# Patient Record
Sex: Female | Born: 1935 | Race: White | Hispanic: No | Smoking: Former smoker
Health system: Southern US, Community
[De-identification: ages and names within clinical notes are randomized; demographics above are authoritative.]

## PROBLEM LIST (undated history)

## (undated) DIAGNOSIS — K5730 Diverticulosis of large intestine without perforation or abscess without bleeding: Secondary | ICD-10-CM

## (undated) DIAGNOSIS — Z86018 Personal history of other benign neoplasm: Secondary | ICD-10-CM

## (undated) DIAGNOSIS — H9190 Unspecified hearing loss, unspecified ear: Secondary | ICD-10-CM

## (undated) DIAGNOSIS — K219 Gastro-esophageal reflux disease without esophagitis: Secondary | ICD-10-CM

## (undated) DIAGNOSIS — M958 Other specified acquired deformities of musculoskeletal system: Secondary | ICD-10-CM

## (undated) DIAGNOSIS — M1990 Unspecified osteoarthritis, unspecified site: Secondary | ICD-10-CM

## (undated) DIAGNOSIS — E785 Hyperlipidemia, unspecified: Secondary | ICD-10-CM

## (undated) DIAGNOSIS — K5792 Diverticulitis of intestine, part unspecified, without perforation or abscess without bleeding: Secondary | ICD-10-CM

## (undated) HISTORY — DX: Hyperlipidemia, unspecified: E78.5

## (undated) HISTORY — DX: Other specified acquired deformities of musculoskeletal system: M95.8

## (undated) HISTORY — DX: Unspecified osteoarthritis, unspecified site: M19.90

## (undated) HISTORY — PX: JOINT REPLACEMENT: SHX530

## (undated) HISTORY — DX: Diverticulitis of intestine, part unspecified, without perforation or abscess without bleeding: K57.92

## (undated) HISTORY — PX: ABDOMINAL HYSTERECTOMY: SHX81

## (undated) HISTORY — PX: CATARACT EXTRACTION, BILATERAL: SHX1313

## (undated) HISTORY — PX: TONSILLECTOMY AND ADENOIDECTOMY: SUR1326

## (undated) HISTORY — DX: Diverticulosis of large intestine without perforation or abscess without bleeding: K57.30

## (undated) HISTORY — DX: Gastro-esophageal reflux disease without esophagitis: K21.9

---
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
## 2004-09-22 HISTORY — PX: CHOLECYSTECTOMY: SHX55

## 2005-04-07 ENCOUNTER — Ambulatory Visit: Payer: Self-pay | Admitting: General Surgery

## 2005-04-09 ENCOUNTER — Ambulatory Visit: Payer: Self-pay | Admitting: General Surgery

## 2005-04-16 ENCOUNTER — Ambulatory Visit: Payer: Self-pay | Admitting: General Surgery

## 2007-12-24 LAB — HM DEXA SCAN

## 2009-02-21 LAB — HM PAP SMEAR: HM Pap smear: NEGATIVE

## 2011-04-24 HISTORY — PX: OTHER SURGICAL HISTORY: SHX169

## 2012-12-15 ENCOUNTER — Encounter: Payer: Self-pay | Admitting: Family Medicine

## 2012-12-15 ENCOUNTER — Telehealth: Payer: Self-pay | Admitting: Family Medicine

## 2012-12-15 ENCOUNTER — Ambulatory Visit (INDEPENDENT_AMBULATORY_CARE_PROVIDER_SITE_OTHER): Payer: Medicare PPO | Admitting: Family Medicine

## 2012-12-15 VITALS — BP 118/80 | HR 56 | Temp 97.3°F | Ht 67.0 in | Wt 171.0 lb

## 2012-12-15 DIAGNOSIS — E785 Hyperlipidemia, unspecified: Secondary | ICD-10-CM

## 2012-12-15 DIAGNOSIS — L989 Disorder of the skin and subcutaneous tissue, unspecified: Secondary | ICD-10-CM | POA: Insufficient documentation

## 2012-12-15 DIAGNOSIS — Z1211 Encounter for screening for malignant neoplasm of colon: Secondary | ICD-10-CM

## 2012-12-15 MED ORDER — ATORVASTATIN CALCIUM 10 MG PO TABS: 10.0000 mg | ORAL_TABLET | Freq: Every day | ORAL | Status: DC

## 2012-12-15 NOTE — Progress Notes (Signed)
Elevated Cholesterol: Using medications without problems:yes Muscle aches: minimal aches, not attributed to statin by patient Diet compliance:yes Exercise:yes She'll return for labs.   D/w patient [id number] for colon cancer screening, including IFOB vs. colonoscopy.  Risks and benefits of both were discussed and patient voiced understanding.  Pt elects for: IFOB.   She'll call about her mammogram.  She had neg DXA prev and f/u on this is deferred.     PMH and SH reviewed  Meds, vitals, and allergies reviewed.   ROS: See HPI.  Otherwise negative.    GEN: nad, alert and oriented HEENT: mucous membranes moist NECK: supple w/o LA CV: rrr. R clavicle with enlargement medially- prev w/u neg per patient PULM: ctab, no inc wob ABD: soft, +bs EXT: no edema SKIN: no acute rash but small round lesion noted on dorsum on L foot.

## 2012-12-15 NOTE — Assessment & Plan Note (Signed)
D/w pt.  Unclear if this is a BCC. ddx d/w pt.  She'll call about f/u with derm.

## 2012-12-15 NOTE — Telephone Encounter (Signed)
Call pt.  Prev with zostavax done and in records.  I don't see anything about a prev td or PNA shot.  It would be reasonable to get both at her convenience.  I would get a flu shot each fall.

## 2012-12-15 NOTE — Patient Instructions (Signed)
Go to the lab on the way out to get the stool cards.  We'll contact you with your lab report. Call [facility] about your mammogram.  Schedule a fasting lab visit.  I'll check your vaccine records.  I would get a flu shot each fall.   Don't change your meds for now.  I would check on the living will papers.  Glad to see you.

## 2012-12-15 NOTE — Assessment & Plan Note (Addendum)
Return for fasting labs.  Continue statin for now.  She agrees. >30 min spent with face to face with patient getting history and for physical exam.

## 2012-12-15 NOTE — Assessment & Plan Note (Signed)
D/w patient re:options for colon cancer screening, including IFOB vs. colonoscopy.  Risks and benefits of both were discussed and patient voiced understanding.  Pt elects for:IFOB  

## 2012-12-16 ENCOUNTER — Encounter: Payer: Self-pay | Admitting: Family Medicine

## 2012-12-16 LAB — TSH: TSH: 2.47

## 2012-12-16 LAB — GLUCOSE (CC13): Creat: 1.02

## 2012-12-16 NOTE — Telephone Encounter (Signed)
Patient advised.  She will call in to schedule lab visit and nurse visit simultaneously.

## 2012-12-23 ENCOUNTER — Ambulatory Visit (INDEPENDENT_AMBULATORY_CARE_PROVIDER_SITE_OTHER): Payer: Medicare PPO | Admitting: *Deleted

## 2012-12-23 ENCOUNTER — Other Ambulatory Visit (INDEPENDENT_AMBULATORY_CARE_PROVIDER_SITE_OTHER): Payer: Medicare PPO

## 2012-12-23 DIAGNOSIS — Z23 Encounter for immunization: Secondary | ICD-10-CM

## 2012-12-23 DIAGNOSIS — E785 Hyperlipidemia, unspecified: Secondary | ICD-10-CM

## 2012-12-23 LAB — COMPREHENSIVE METABOLIC PANEL
AST: 28 U/L (ref 0–37)
BUN: 18 mg/dL (ref 6–23)
CO2: 29 mEq/L (ref 19–32)
Calcium: 8.8 mg/dL (ref 8.4–10.5)
Chloride: 104 mEq/L (ref 96–112)
Creatinine, Ser: 1 mg/dL (ref 0.4–1.2)
GFR: 59.62 mL/min — ABNORMAL LOW (ref >60.00)

## 2012-12-23 LAB — LIPID PANEL
Cholesterol: 162 mg/dL (ref 0–200)
HDL: 39.5 mg/dL (ref >39.00)
Triglycerides: 130 mg/dL (ref 0.0–149.0)

## 2012-12-24 ENCOUNTER — Other Ambulatory Visit (INDEPENDENT_AMBULATORY_CARE_PROVIDER_SITE_OTHER): Payer: Medicare PPO

## 2012-12-24 DIAGNOSIS — Z1211 Encounter for screening for malignant neoplasm of colon: Secondary | ICD-10-CM

## 2012-12-27 ENCOUNTER — Encounter: Payer: Self-pay | Admitting: Family Medicine

## 2013-06-17 ENCOUNTER — Encounter: Payer: Self-pay | Admitting: Family Medicine

## 2013-06-20 ENCOUNTER — Encounter: Payer: Self-pay | Admitting: *Deleted

## 2013-09-22 HISTORY — PX: CATARACT EXTRACTION: SUR2

## 2013-11-24 ENCOUNTER — Ambulatory Visit: Payer: Medicare PPO | Admitting: Internal Medicine

## 2013-11-28 ENCOUNTER — Encounter: Payer: Self-pay | Admitting: Internal Medicine

## 2013-11-28 ENCOUNTER — Ambulatory Visit: Payer: Medicare PPO | Admitting: Family Medicine

## 2013-11-28 ENCOUNTER — Ambulatory Visit (INDEPENDENT_AMBULATORY_CARE_PROVIDER_SITE_OTHER): Payer: Medicare PPO | Admitting: Internal Medicine

## 2013-11-28 VITALS — BP 126/78 | HR 64 | Temp 97.7°F | Wt 162.0 lb

## 2013-11-28 DIAGNOSIS — J069 Acute upper respiratory infection, unspecified: Secondary | ICD-10-CM

## 2013-11-28 DIAGNOSIS — H6120 Impacted cerumen, unspecified ear: Secondary | ICD-10-CM

## 2013-11-28 NOTE — Progress Notes (Signed)
Pre visit review using our clinic review tool, if applicable. No additional management support is needed unless otherwise documented below in the visit note. 

## 2013-11-28 NOTE — Progress Notes (Signed)
HPI  Pt presents to the clinic today with c/o nasal congestion, sore throat, cough and chest congestion. She reports this started 1 week ago. The cough is productive of thick white sputum.. She denies fever, chills, body aches shortness of breath or wheezing. She has no history of allergies. She does have asthma. She does feel like her symptoms are improving.  Review of Systems      Past Medical History  Diagnosis Date  . Hyperlipidemia   . Acquired clavicle deformity     medial portion of R clavicle with enlargement- prev neg w/u per patient report  . Diverticulosis of colon without hemorrhage   . Arthritis     Osteoarthritis  . Hypertension   . Asthma     Intermittent  . GERD (gastroesophageal reflux disease)     Family History  Problem Relation Age of Onset  . Stroke Mother   . COPD Sister   . Thyroid disease Sister   . Heart disease Brother   . Colon cancer Neg Hx   . Thyroid disease Other     History   Social History  . Marital Status: Married    Spouse Name: N/A    Number of Children: N/A  . Years of Education: N/A   Occupational History  . Not on file.   Social History Main Topics  . Smoking status: Former Research scientist (life sciences)  . Smokeless tobacco: Never Used  . Alcohol Use: Yes     Comment: rare  . Drug Use: No  . Sexual Activity: Not on file   Other Topics Concern  . Not on file   Social History Narrative   Married 1977- husband with cancer as of 2014   2 step children   retired    No Known Allergies   Constitutional: Positive fatigue. Denies headache, fever or abrupt weight changes.  HEENT:  Positive nasal congestion. Denies eye redness, eye pain, pressure behind the eyes, facial pain, ear pain, ringing in the ears, wax buildup, runny nose or bloody nose. Respiratory: Positive cough. Denies difficulty breathing or shortness of breath.  Cardiovascular: Denies chest pain, chest tightness, palpitations or swelling in the hands or feet.   No other specific  complaints in a complete review of systems (except as listed in HPI above).  Objective:   BP 126/78  Pulse 64  Temp(Src) 97.7 F (36.5 C) (Oral)  Wt 162 lb (73.483 kg)  SpO2 97% Wt Readings from Last 3 Encounters:  11/28/13 162 lb (73.483 kg)  12/15/12 171 lb (77.565 kg)     General: Appears her stated age, well developed, well nourished in NAD. HEENT: Head: normal shape and size; Eyes: sclera white, no icterus, conjunctiva pink, PERRLA and EOMs intact; Ears: cerumen impaction on the left; Nose: mucosa pink and moist, septum midline; Throat/Mouth: + PND. Teeth present, mucosa erythematous and moist, no exudate noted, no lesions or ulcerations noted.  Neck:Neck supple, trachea midline. No massses, lumps or thyromegaly present.  Cardiovascular: Normal rate and rhythm. S1,S2 noted.  No murmur, rubs or gallops noted. No JVD or BLE edema. No carotid bruits noted. Pulmonary/Chest: Normal effort and positive vesicular breath sounds. No respiratory distress. No wheezes, rales or ronchi noted.      Assessment & Plan:   Upper Respiratory Infection, likely viral:  Seems to be improving Continue Mucinex for now [facility] to use cough drops for cough Monitor for fever, chills, purulent drainage  Left ear cerumen impaction:  Manually removed with curette by this provider OK to  use Debrox OTC  RTC as needed or if symptoms persist.

## 2013-11-28 NOTE — Patient Instructions (Signed)

## 2014-03-20 ENCOUNTER — Other Ambulatory Visit: Payer: Self-pay | Admitting: Family Medicine

## 2014-03-25 ENCOUNTER — Other Ambulatory Visit: Payer: Self-pay | Admitting: Family Medicine

## 2014-04-05 ENCOUNTER — Ambulatory Visit: Payer: Self-pay | Admitting: Ophthalmology

## 2014-04-10 ENCOUNTER — Ambulatory Visit: Payer: Self-pay | Admitting: Ophthalmology

## 2014-05-31 ENCOUNTER — Emergency Department: Payer: Self-pay | Admitting: Emergency Medicine

## 2014-05-31 ENCOUNTER — Ambulatory Visit: Payer: Medicare PPO | Admitting: Family Medicine

## 2014-05-31 LAB — COMPREHENSIVE METABOLIC PANEL
ALBUMIN: 3.6 g/dL (ref 3.4–5.0)
ALK PHOS: 111 U/L
Anion Gap: 7 (ref 7–16)
BUN: 25 mg/dL — ABNORMAL HIGH (ref 7–18)
Bilirubin,Total: 1.2 mg/dL — ABNORMAL HIGH (ref 0.2–1.0)
CHLORIDE: 104 mmol/L (ref 98–107)
CREATININE: 1.21 mg/dL (ref 0.60–1.30)
Calcium, Total: 8.9 mg/dL (ref 8.5–10.1)
Co2: 24 mmol/L (ref 21–32)
EGFR (African American): 49 — ABNORMAL LOW
EGFR (Non-African Amer.): 42 — ABNORMAL LOW
Glucose: 131 mg/dL — ABNORMAL HIGH (ref 65–99)
Osmolality: 276 (ref 275–301)
POTASSIUM: 3.8 mmol/L (ref 3.5–5.1)
SGOT(AST): 18 U/L (ref 15–37)
SGPT (ALT): 21 U/L
Sodium: 135 mmol/L — ABNORMAL LOW (ref 136–145)
TOTAL PROTEIN: 7.5 g/dL (ref 6.4–8.2)

## 2014-05-31 LAB — URINALYSIS, COMPLETE
BACTERIA: NONE SEEN
BILIRUBIN, UR: NEGATIVE
Blood: NEGATIVE
GLUCOSE, UR: NEGATIVE mg/dL (ref 0–75)
Hyaline Cast: 3
Ketone: NEGATIVE
LEUKOCYTE ESTERASE: NEGATIVE
Nitrite: NEGATIVE
PH: 5 (ref 4.5–8.0)
Protein: NEGATIVE
RBC,UR: NONE SEEN /HPF (ref 0–5)
Specific Gravity: 1.01 (ref 1.003–1.030)
Squamous Epithelial: 3

## 2014-05-31 LAB — CBC
HCT: 45.5 % (ref 35.0–47.0)
HGB: 14.6 g/dL (ref 12.0–16.0)
MCH: 29.4 pg (ref 26.0–34.0)
MCHC: 32.1 g/dL (ref 32.0–36.0)
MCV: 91 fL (ref 80–100)
Platelet: 226 10*3/uL (ref 150–440)
RBC: 4.98 10*6/uL (ref 3.80–5.20)
RDW: 13.3 % (ref 11.5–14.5)
WBC: 11 10*3/uL (ref 3.6–11.0)

## 2014-05-31 LAB — LIPASE, BLOOD: Lipase: 165 U/L (ref 73–393)

## 2014-05-31 LAB — TROPONIN I

## 2014-06-06 ENCOUNTER — Ambulatory Visit (INDEPENDENT_AMBULATORY_CARE_PROVIDER_SITE_OTHER): Payer: Medicare PPO | Admitting: Family Medicine

## 2014-06-06 ENCOUNTER — Encounter: Payer: Self-pay | Admitting: Family Medicine

## 2014-06-06 VITALS — BP 116/80 | HR 65 | Temp 97.7°F | Wt 164.2 lb

## 2014-06-06 DIAGNOSIS — M7062 Trochanteric bursitis, left hip: Secondary | ICD-10-CM

## 2014-06-06 DIAGNOSIS — K5732 Diverticulitis of large intestine without perforation or abscess without bleeding: Secondary | ICD-10-CM

## 2014-06-06 DIAGNOSIS — M76899 Other specified enthesopathies of unspecified lower limb, excluding foot: Secondary | ICD-10-CM

## 2014-06-06 NOTE — Patient Instructions (Signed)
I am presuming you had either diverticulitis or colitis.  You would treat them the same way for now.  You look good.  If you have more troubles, then notify me.

## 2014-06-06 NOTE — Progress Notes (Signed)
Pre visit review using our clinic review tool, if applicable. No additional management support is needed unless otherwise documented below in the visit note.  She had mult episodes over the last few years where she would think she had a "24 hour stomach bug".  It would get better.  Then last week she had more pain, persistent.  Went to ER for abd pain and bloating. CT done, dx'd with colitis. She is feeling better, now with less bloating, BMs back to normal, and no pain.  She fell 06/02/14, accidentally, crossing a ditch.  No LOC. Still with some mild L hip pain, better now than prev.  Can bear weight.  "My pride got hurt worse than anything else."  Some discomfort right when getting out of the chair, but then it gets better.  Discussed fall precautions.    Prev with sigmoidoscopy prev done.    Meds, vitals, and allergies reviewed.   ROS: See HPI.  Otherwise, noncontributory.  nad ncat Mmm rrr ctab Abd: not ttp, normal BS, no rebound, no masses Ext w/o edema.  L hip with normal ROM, back not ttp Minimally ttp at the L trochanteric bursa.  Normal gait, normal weight bearing.

## 2014-06-07 ENCOUNTER — Encounter: Payer: Self-pay | Admitting: Family Medicine

## 2014-06-07 DIAGNOSIS — K5732 Diverticulitis of large intestine without perforation or abscess without bleeding: Secondary | ICD-10-CM | POA: Insufficient documentation

## 2014-06-07 DIAGNOSIS — M7060 Trochanteric bursitis, unspecified hip: Secondary | ICD-10-CM | POA: Insufficient documentation

## 2014-06-07 NOTE — Assessment & Plan Note (Signed)
Mild, improving, no need to image.  Anatomy d/w pt.  F/u prn.  She agrees.

## 2014-06-07 NOTE — Assessment & Plan Note (Signed)
Now much improved, resolved.  CT reports from hospital noted.   Would hesitate for colonoscopy given her age, d/w pt, esp since she is so much better, so quickly.  We can refer if needed in the future.  She agrees.  Continue abx for now with routine cautions.  >25 minutes spent in face to face time with patient, >50% spent in counselling or coordination of care.

## 2014-06-20 ENCOUNTER — Encounter: Payer: Self-pay | Admitting: Family Medicine

## 2014-06-20 ENCOUNTER — Ambulatory Visit (INDEPENDENT_AMBULATORY_CARE_PROVIDER_SITE_OTHER): Payer: Medicare PPO | Admitting: Family Medicine

## 2014-06-20 VITALS — BP 110/78 | HR 74 | Temp 97.9°F | Wt 159.0 lb

## 2014-06-20 DIAGNOSIS — K5732 Diverticulitis of large intestine without perforation or abscess without bleeding: Secondary | ICD-10-CM

## 2014-06-20 LAB — CBC WITH DIFFERENTIAL/PLATELET
BASOS PCT: 0.5 % (ref 0.0–3.0)
Basophils Absolute: 0 10*3/uL (ref 0.0–0.1)
EOS PCT: 1.8 % (ref 0.0–5.0)
Eosinophils Absolute: 0.1 10*3/uL (ref 0.0–0.7)
HEMATOCRIT: 40 % (ref 36.0–46.0)
Hemoglobin: 13.2 g/dL (ref 12.0–15.0)
LYMPHS ABS: 1.3 10*3/uL (ref 0.7–4.0)
Lymphocytes Relative: 16.4 % (ref 12.0–46.0)
MCHC: 33 g/dL (ref 30.0–36.0)
MCV: 90.1 fl (ref 78.0–100.0)
MONO ABS: 0.7 10*3/uL (ref 0.1–1.0)
Monocytes Relative: 8.6 % (ref 3.0–12.0)
Neutro Abs: 5.9 10*3/uL (ref 1.4–7.7)
Neutrophils Relative %: 72.7 % (ref 43.0–77.0)
PLATELETS: 253 10*3/uL (ref 150.0–400.0)
RBC: 4.44 Mil/uL (ref 3.87–5.11)
RDW: 13.3 % (ref 11.5–15.5)
WBC: 8.2 10*3/uL (ref 4.0–10.5)

## 2014-06-20 MED ORDER — CIPROFLOXACIN HCL 500 MG PO TABS: 500.0000 mg | ORAL_TABLET | Freq: Two times a day (BID) | ORAL | Status: DC

## 2014-06-20 MED ORDER — METRONIDAZOLE 500 MG PO TABS: 500.0000 mg | ORAL_TABLET | Freq: Two times a day (BID) | ORAL | Status: DC

## 2014-06-20 NOTE — Patient Instructions (Signed)
Go to the lab on the way out.  We'll contact you with your lab report.  Restart the antibiotics in the meantime if you have more abdominal pain.  Update [place] if needed.  Rosaria Ferries will call about your referral.

## 2014-06-20 NOTE — Progress Notes (Signed)
Pre visit review using our clinic review tool, if applicable. No additional management support is needed unless otherwise documented below in the visit note.  She had likely diverticulitis, dx'd with colitis. She improved on abx, done with meds now.  In the last few days, some return of lower abd pain, some black stools, some diarrhea.  No gross blood in stool.  No fevers.  No abd pain now.  She is some better today than the last few days.  She brought a stool sample today.    Meds, vitals, and allergies reviewed.   ROS: See HPI.  Otherwise, noncontributory.  GEN: nad, alert and oriented HEENT: mucous membranes moist NECK: supple w/o LA CV: rrr.  PULM: ctab, no inc wob ABD: soft, +bs, not ttp, no rebound EXT: no edema SKIN: no acute rash

## 2014-06-21 NOTE — Assessment & Plan Note (Signed)
Hold abx for now, if worsening then restart.  She agrees.  She is some better today.   Recheck CBC and check IFOB today.  See notes on labs.  Refer to GI.  She agrees.  Okay for outpatient f/u.

## 2014-06-22 ENCOUNTER — Other Ambulatory Visit (INDEPENDENT_AMBULATORY_CARE_PROVIDER_SITE_OTHER): Payer: Medicare PPO

## 2014-06-22 DIAGNOSIS — K5732 Diverticulitis of large intestine without perforation or abscess without bleeding: Secondary | ICD-10-CM

## 2014-06-22 LAB — FECAL OCCULT BLOOD, IMMUNOCHEMICAL: FECAL OCCULT BLD: POSITIVE — AB

## 2014-06-23 ENCOUNTER — Ambulatory Visit: Payer: Medicare PPO | Admitting: Gastroenterology

## 2014-06-23 ENCOUNTER — Telehealth: Payer: Self-pay | Admitting: Family Medicine

## 2014-06-23 ENCOUNTER — Telehealth: Payer: Self-pay | Admitting: Gastroenterology

## 2014-06-23 NOTE — Telephone Encounter (Signed)
No Charge 

## 2014-06-23 NOTE — Telephone Encounter (Signed)
Pt returned call regarding lab results. Please call home #

## 2014-07-10 ENCOUNTER — Encounter: Payer: Self-pay | Admitting: Gastroenterology

## 2014-07-10 ENCOUNTER — Ambulatory Visit (INDEPENDENT_AMBULATORY_CARE_PROVIDER_SITE_OTHER): Payer: Medicare PPO | Admitting: Gastroenterology

## 2014-07-10 VITALS — BP 130/82 | HR 60 | Ht 66.0 in | Wt 162.1 lb

## 2014-07-10 DIAGNOSIS — R195 Other fecal abnormalities: Secondary | ICD-10-CM

## 2014-07-10 MED ORDER — NA SULFATE-K SULFATE-MG SULF 17.5-3.13-1.6 GM/177ML PO SOLN: 1.0000 | Freq: Once | ORAL | Status: DC

## 2014-07-10 NOTE — Progress Notes (Signed)
_                                                                                                                History of Present Illness:  Linda Juarez is an 80 year old white female referred for evaluation of abdominal pain.  In September, 2015 she had a couple of weeks of moderately severe left lower quadrant pain.  She was evaluated by CT which, by report, demonstrated diffuse wall thickening involving loops of the distal ileum suggestive of enteritis of either infectious or ischemic causes, a small amount of adjacent free fluid, and colonic diverticulosis.  Question of mild proximal sigmoid colonic wall thickening was raised.  She was placed on Cipro and Flagyl with resolution of symptoms.  While having diarrhea she noted one episode of black stools.  She tested Hemoccult positive.  Hemoglobin on September 29 was 13.2.  She is without pain-free.  She has no GI complaints.  Bowels are back to normal.  She claims that, in the past, she's had previous episodes of lower abdominal pain followed by nausea and vomiting that usually do not last more than 24 hours.  Patient's never had a colonoscopy.   Past Medical History  Diagnosis Date  . Hyperlipidemia   . Acquired clavicle deformity     medial portion of R clavicle with enlargement- prev neg w/u per patient report  . Diverticulosis of colon without hemorrhage   . Arthritis     Osteoarthritis  . Hypertension   . Asthma     Intermittent  . GERD (gastroesophageal reflux disease)   . Diverticulitis    Past Surgical History  Procedure Laterality Date  . Tonsillectomy and adenoidectomy    . Cholecystectomy  2006  . Abdominal hysterectomy  1980's  . Stress cardiolite  04/24/11    Normal perfusion study with no stress induced ischemia   family history includes COPD in her sister; Heart disease in her brother; Stroke in her mother; Thyroid disease in her other and sister. There is no history of Colon cancer, Colon polyps,  Diabetes, Kidney disease, or Esophageal cancer. Current Outpatient Prescriptions  Medication Sig Dispense Refill  . aspirin 81 MG tablet Take 81 mg by mouth daily.      Marland Kitchen atorvastatin (LIPITOR) 10 MG tablet TAKE 1 TABLET BY MOUTH EVERY DAY  90 tablet  1  . Cholecalciferol (VITAMIN D-3) 1000 UNITS CAPS Take 1 capsule by mouth daily.      . Multiple Vitamins-Minerals (CENTRUM SILVER ADULT 50+) TABS Take 1 tablet by mouth as needed.        No current facility-administered medications for this visit.   Allergies as of 07/10/2014  . (No Known Allergies)    reports that she quit smoking about 27 years ago. Her smoking use included Cigarettes. She smoked 0.50 packs per day. She has never used smokeless tobacco. She reports that she drinks alcohol. She reports that she does not use illicit drugs.   Review of Systems: Pertinent positive and negative review of  systems were noted in the above HPI section. All other review of systems were otherwise negative.  Vital signs were reviewed in today's medical record Physical Exam: General: Well developed , well nourished, no acute distress Skin: anicteric Head: Normocephalic and atraumatic Eyes:  sclerae anicteric, EOMI Ears: Normal auditory acuity Mouth: No deformity or lesions Neck: Supple, no masses or thyromegaly Lungs: Clear throughout to auscultation Heart: Regular rate and rhythm; no murmurs, rubs or bruits Abdomen: Soft, non tender and non distended. No masses, hepatosplenomegaly or hernias noted. Normal Bowel sounds Rectal:deferred Musculoskeletal: Symmetrical with no gross deformities  Skin: No lesions on visible extremities Pulses:  Normal pulses noted Extremities: No clubbing, cyanosis, edema or deformities noted Neurological: Alert oriented x 4, grossly nonfocal Cervical Nodes:  No significant cervical adenopathy Inguinal Nodes: No significant inguinal adenopathy Psychological:  Alert and cooperative. Normal mood and affect  See  Assessment and Plan under Problem List

## 2014-07-10 NOTE — Assessment & Plan Note (Signed)
Plan screening colonoscopy 

## 2014-07-10 NOTE — Patient Instructions (Signed)
You have been scheduled for a colonoscopy. Please follow written instructions given to you at your visit today.  Please pick up your prep kit at the pharmacy within the next 1-3 days. If you use inhalers (even only as needed), please bring them with you on the day of your procedure. Your physician has requested that you go to www.startemmi.com and enter the access code given to you at your visit today. This web site gives a general overview about your procedure. However, you should still follow specific instructions given to you by our office regarding your preparation for the procedure.

## 2014-07-10 NOTE — Assessment & Plan Note (Signed)
Recent episode of prolonged left lower quadrant pain with equivocal CT findings suggestive of thickening of the terminal ileum, questionable  thickening of the proximal sigmoid versus underdistention.  She may have had an enteric infection.  Diverticulitis seems less likely.  It is noteworthy that she tested Hemoccult positive.  Recommendations #1 colonoscopy

## 2014-07-19 ENCOUNTER — Encounter: Payer: Self-pay | Admitting: Gastroenterology

## 2014-08-28 ENCOUNTER — Telehealth: Payer: Self-pay | Admitting: Gastroenterology

## 2014-08-28 NOTE — Telephone Encounter (Signed)
No

## 2014-08-30 ENCOUNTER — Encounter: Payer: Medicare PPO | Admitting: Gastroenterology

## 2014-09-04 ENCOUNTER — Encounter: Payer: Self-pay | Admitting: Family Medicine

## 2014-12-26 ENCOUNTER — Telehealth: Payer: Self-pay | Admitting: Family Medicine

## 2014-12-26 NOTE — Telephone Encounter (Signed)
Patient called back to let you know she had her flu shot at [facility] on 12/25/14.  Please document.

## 2014-12-26 NOTE — Telephone Encounter (Signed)
Documented in chart.

## 2015-01-13 NOTE — Op Note (Signed)
PATIENT NAME:  Linda Juarez, VIOLANTE MR#:  [id number] DATE OF BIRTH:  [birth year]  DATE OF PROCEDURE:  04/10/2014  PREOPERATIVE DIAGNOSIS:  Cataract, left eye.    POSTOPERATIVE DIAGNOSIS:  Cataract, left eye.  PROCEDURE PERFORMED:  Extracapsular cataract extraction using phacoemulsification with placement of an Alcon [id number], 23.5-diopter posterior chamber lens, serial [id number].  SURGEON:  Loura Back. Jenny Lai, MD  ASSISTANT:  None.  ANESTHESIA:  4% lidocaine and 0.75% Marcaine in a 50/50 mixture with 10 units/mL of Hylenex added, given as a peribulbar.   ANESTHESIOLOGIST:  Dr. Benjamine Mola.  COMPLICATIONS:  None.  ESTIMATED BLOOD LOSS:  Less than 1 ml.  DESCRIPTION OF PROCEDURE:  The patient was brought to the operating room and given a peribulbar block.  The patient was then prepped and draped in the usual fashion.  The vertical rectus muscles were imbricated using 5-0 silk sutures.  These sutures were then clamped to the sterile drapes as bridle sutures.  A limbal peritomy was performed extending two clock hours and hemostasis was obtained with cautery.  A partial thickness scleral groove was made at the surgical limbus and dissected anteriorly in a lamellar dissection using an Alcon crescent knife.  The anterior chamber was entered supero-temporally with a Superblade and through the lamellar dissection with a 2.6 mm keratome.  DisCoVisc was used to replace the aqueous and a continuous tear capsulorrhexis was carried out.  Hydrodissection and hydrodelineation were carried out with balanced salt and a 27 gauge canula.  The nucleus was rotated to confirm the effectiveness of the hydrodissection.  Phacoemulsification was carried out using a divide-and-conquer technique.  Total ultrasound time was 1 minute and 54 seconds with an average power of 26.9 percent and CDE of 54.86.  Irrigation/aspiration was used to remove the residual cortex.  DisCoVisc was used to inflate the capsule and the internal  incision was enlarged to 3 mm with the crescent knife.  The intraocular lens was folded and inserted into the capsular bag using the AcrySert delivery system. Irrigation/aspiration was used to remove the residual DisCoVisc.  Miostat was injected into the anterior chamber through the paracentesis track to inflate the anterior chamber and induce miosis. A tenth of a milliliter of cefuroxime was injected via the paracentesis tract containing 1 mg of drug. The wound was checked for leaks and none were found. The conjunctiva was closed with cautery and the bridle sutures were removed.  Two drops of 0.3% Vigamox were placed on the eye.   An eye shield was placed on the eye.  The patient was discharged to the recovery room in good condition.   ____________________________ Loura Back Oluwanifemi Petitti, MD sad:sb D: 04/10/2014 10:52:35 ET T: 04/10/2014 11:15:39 ET JOB#: [id number]  cc: Remo Lipps A. Gari Hartsell, MD, <Dictator> Martie Lee MD ELECTRONICALLY SIGNED 04/10/2014 12:35

## 2015-01-16 ENCOUNTER — Other Ambulatory Visit: Payer: Self-pay | Admitting: Family Medicine

## 2015-01-16 DIAGNOSIS — E785 Hyperlipidemia, unspecified: Secondary | ICD-10-CM | POA: Insufficient documentation

## 2015-01-18 ENCOUNTER — Other Ambulatory Visit (INDEPENDENT_AMBULATORY_CARE_PROVIDER_SITE_OTHER): Payer: Medicare PPO

## 2015-01-18 DIAGNOSIS — E785 Hyperlipidemia, unspecified: Secondary | ICD-10-CM

## 2015-01-18 LAB — LIPID PANEL
Cholesterol: 225 mg/dL — ABNORMAL HIGH (ref 0–200)
HDL: 47.1 mg/dL (ref >39.00)
LDL Cholesterol: 159 mg/dL — ABNORMAL HIGH (ref 0–99)
NonHDL: 177.9
Total CHOL/HDL Ratio: 5
Triglycerides: 94 mg/dL (ref 0.0–149.0)
VLDL: 18.8 mg/dL (ref 0.0–40.0)

## 2015-01-18 LAB — COMPREHENSIVE METABOLIC PANEL
ALT: 24 U/L (ref 0–35)
AST: 25 U/L (ref 0–37)
Albumin: 4.1 g/dL (ref 3.5–5.2)
Alkaline Phosphatase: 95 U/L (ref 39–117)
BUN: 20 mg/dL (ref 6–23)
CALCIUM: 9.5 mg/dL (ref 8.4–10.5)
CHLORIDE: 103 meq/L (ref 96–112)
CO2: 30 meq/L (ref 19–32)
CREATININE: 1 mg/dL (ref 0.40–1.20)
GFR: 56.58 mL/min — AB (ref >60.00)
Glucose, Bld: 93 mg/dL (ref 70–99)
Potassium: 4.2 mEq/L (ref 3.5–5.1)
Sodium: 138 mEq/L (ref 135–145)
Total Bilirubin: 0.5 mg/dL (ref 0.2–1.2)
Total Protein: 6.8 g/dL (ref 6.0–8.3)

## 2015-01-26 ENCOUNTER — Ambulatory Visit (INDEPENDENT_AMBULATORY_CARE_PROVIDER_SITE_OTHER): Payer: Medicare PPO | Admitting: Family Medicine

## 2015-01-26 ENCOUNTER — Encounter: Payer: Self-pay | Admitting: Family Medicine

## 2015-01-26 VITALS — BP 140/80 | HR 63 | Temp 97.6°F | Ht 66.0 in | Wt 161.0 lb

## 2015-01-26 DIAGNOSIS — E785 Hyperlipidemia, unspecified: Secondary | ICD-10-CM

## 2015-01-26 DIAGNOSIS — Z23 Encounter for immunization: Secondary | ICD-10-CM | POA: Diagnosis not present

## 2015-01-26 DIAGNOSIS — L989 Disorder of the skin and subcutaneous tissue, unspecified: Secondary | ICD-10-CM | POA: Diagnosis not present

## 2015-01-26 DIAGNOSIS — Z0000 Encounter for general adult medical examination without abnormal findings: Secondary | ICD-10-CM

## 2015-01-26 DIAGNOSIS — Z7189 Other specified counseling: Secondary | ICD-10-CM

## 2015-01-26 NOTE — Progress Notes (Signed)
Pre visit review using our clinic review tool, if applicable. No additional management support is needed unless otherwise documented below in the visit note.  I have personally reviewed the Medicare Annual Wellness questionnaire and have noted 1. The patient's medical and social history 2. Their use of alcohol, tobacco or illicit drugs 3. Their current medications and supplements 4. The patient's functional ability including ADL's, fall risks, home safety risks and hearing or visual             impairment. 5. Diet and physical activities 6. Evidence for depression or mood disorders  The patients weight, height, BMI have been recorded in the chart and visual acuity is per eye clinic.  I have made referrals, counseling and provided education to the patient based review of the above and I have provided the pt with a written personalized care plan for preventive services.  Provider list updated- see scanned forms.  Routine anticipatory guidance given to patient.  See health maintenance.  Flu 2016 Shingles 2010 PNA up to date.  Tetanus 2014 Colonoscopy na due to age.  She agrees.  Breast cancer screening- she'll call about scheduling mammogram.  DXA declined for now by patient.  D/w pt.  Advance directive- niece Rita Ohara designated if patient were incapacitated.  Cognitive function addressed- see scanned forms- and if abnormal then additional documentation follows.  Widowed since last CPE, d/w pt.  She is trying to adjust to her situation.  Mood is still appropriate.   HLD.  Off meds, she didn't want to restart.  See plan.   Skin lesion noted in R groin.  She had expressed some material prev.  Not sore now.    PMH and SH reviewed  Meds, vitals, and allergies reviewed.   ROS: See HPI.  Otherwise negative.    GEN: nad, alert and oriented HEENT: mucous membranes moist NECK: supple w/o LA CV: rrr. PULM: ctab, no inc wob ABD: soft, +bs EXT: no edema SKIN: no acute rash but  chaperoned exam and noted 74mm lesion in the R groin, looks like a small open comedone.  No fluctuant mass.

## 2015-01-26 NOTE — Patient Instructions (Signed)
Call about a mammogram.   Take care.  Glad to see you.   Use warm compresses on the area and it should resolve.  If not, then let me recheck it.

## 2015-01-28 DIAGNOSIS — Z7189 Other specified counseling: Secondary | ICD-10-CM | POA: Insufficient documentation

## 2015-01-28 DIAGNOSIS — Z0000 Encounter for general adult medical examination without abnormal findings: Secondary | ICD-10-CM | POA: Insufficient documentation

## 2015-01-28 NOTE — Assessment & Plan Note (Signed)
Flu 2016 Shingles 2010 PNA up to date.  Tetanus 2014 Colonoscopy na due to age.  She agrees.  Breast cancer screening- she'll call about scheduling mammogram.  DXA declined for now by patient.  D/w pt.  Advance directive- niece Rita Ohara designated if patient were incapacitated.  Cognitive function addressed- see scanned forms- and if abnormal then additional documentation follows.  Widowed since last CPE, d/w pt.  She is trying to adjust to her situation.  Mood is still appropriate.

## 2015-01-28 NOTE — Assessment & Plan Note (Signed)
She doesn't have a compelling indication other than her lipid levels themselves and she prefers to stay off statin.  This is reasonable.  Work on diet and exercise, we can recheck periodically.  She agrees.

## 2015-01-28 NOTE — Assessment & Plan Note (Signed)
Warm compresses, should resolve.  D/w pt.  No need for intervention o/w.

## 2015-03-27 DIAGNOSIS — H3531 Nonexudative age-related macular degeneration: Secondary | ICD-10-CM | POA: Diagnosis not present

## 2015-03-27 DIAGNOSIS — H2511 Age-related nuclear cataract, right eye: Secondary | ICD-10-CM | POA: Diagnosis not present

## 2015-05-18 DIAGNOSIS — H2511 Age-related nuclear cataract, right eye: Secondary | ICD-10-CM | POA: Diagnosis not present

## 2015-05-22 DIAGNOSIS — H2511 Age-related nuclear cataract, right eye: Secondary | ICD-10-CM | POA: Diagnosis not present

## 2015-05-30 ENCOUNTER — Encounter: Payer: Self-pay | Admitting: *Deleted

## 2015-06-03 NOTE — H&P (Signed)
See scanned H&P

## 2015-06-04 ENCOUNTER — Ambulatory Visit
Admission: RE | Admit: 2015-06-04 | Discharge: 2015-06-04 | Disposition: A | Discharge status: Home / Self Care | Payer: Medicare PPO | Source: Ambulatory Visit | Attending: Ophthalmology | Admitting: Ophthalmology

## 2015-06-04 ENCOUNTER — Ambulatory Visit: Payer: Medicare PPO | Admitting: Certified Registered Nurse Anesthetist

## 2015-06-04 ENCOUNTER — Encounter
Admission: RE | Disposition: A | Discharge status: Home / Self Care | Payer: Self-pay | Source: Ambulatory Visit | Attending: Ophthalmology

## 2015-06-04 ENCOUNTER — Encounter: Payer: Self-pay | Admitting: *Deleted

## 2015-06-04 DIAGNOSIS — M1990 Unspecified osteoarthritis, unspecified site: Secondary | ICD-10-CM | POA: Insufficient documentation

## 2015-06-04 DIAGNOSIS — H9190 Unspecified hearing loss, unspecified ear: Secondary | ICD-10-CM | POA: Diagnosis not present

## 2015-06-04 DIAGNOSIS — Z9842 Cataract extraction status, left eye: Secondary | ICD-10-CM | POA: Insufficient documentation

## 2015-06-04 DIAGNOSIS — Z87891 Personal history of nicotine dependence: Secondary | ICD-10-CM | POA: Insufficient documentation

## 2015-06-04 DIAGNOSIS — E780 Pure hypercholesterolemia: Secondary | ICD-10-CM | POA: Diagnosis not present

## 2015-06-04 DIAGNOSIS — H2511 Age-related nuclear cataract, right eye: Secondary | ICD-10-CM | POA: Insufficient documentation

## 2015-06-04 DIAGNOSIS — Z79899 Other long term (current) drug therapy: Secondary | ICD-10-CM | POA: Insufficient documentation

## 2015-06-04 DIAGNOSIS — Z9049 Acquired absence of other specified parts of digestive tract: Secondary | ICD-10-CM | POA: Diagnosis not present

## 2015-06-04 DIAGNOSIS — H269 Unspecified cataract: Secondary | ICD-10-CM | POA: Diagnosis not present

## 2015-06-04 DIAGNOSIS — Z90710 Acquired absence of both cervix and uterus: Secondary | ICD-10-CM | POA: Diagnosis not present

## 2015-06-04 HISTORY — DX: Unspecified hearing loss, unspecified ear: H91.90

## 2015-06-04 HISTORY — PX: CATARACT EXTRACTION W/PHACO: SHX586

## 2015-06-04 SURGERY — PHACOEMULSIFICATION, CATARACT, WITH IOL INSERTION
Anesthesia: Monitor Anesthesia Care | Laterality: Right | Wound class: Clean

## 2015-06-04 MED ORDER — LIDOCAINE HCL (PF) 4 % IJ SOLN
INTRAMUSCULAR | Status: AC
  Filled 2015-06-04: qty 5

## 2015-06-04 MED ORDER — CARBACHOL 0.01 % IO SOLN
INTRAOCULAR | Status: DC | PRN
  Administered 2015-06-04: 0.5 mL via INTRAOCULAR

## 2015-06-04 MED ORDER — LIDOCAINE HCL (PF) 4 % IJ SOLN
INTRAOCULAR | Status: DC | PRN
  Administered 2015-06-04: 4 mL via OPHTHALMIC

## 2015-06-04 MED ORDER — ALFENTANIL 500 MCG/ML IJ INJ
INJECTION | INTRAMUSCULAR | Status: DC | PRN
  Administered 2015-06-04: 300 ug via INTRAVENOUS
  Administered 2015-06-04: 100 ug via INTRAVENOUS

## 2015-06-04 MED ORDER — CYCLOPENTOLATE HCL 2 % OP SOLN
OPHTHALMIC | Status: AC
  Administered 2015-06-04: 1 [drp] via OPHTHALMIC
  Filled 2015-06-04: qty 2

## 2015-06-04 MED ORDER — MOXIFLOXACIN HCL 0.5 % OP SOLN
OPHTHALMIC | Status: AC
  Administered 2015-06-04: 1 [drp] via OPHTHALMIC
  Filled 2015-06-04: qty 3

## 2015-06-04 MED ORDER — BUPIVACAINE HCL (PF) 0.75 % IJ SOLN
INTRAMUSCULAR | Status: AC
  Filled 2015-06-04: qty 10

## 2015-06-04 MED ORDER — MIDAZOLAM HCL 2 MG/2ML IJ SOLN
INTRAMUSCULAR | Status: DC | PRN
  Administered 2015-06-04: 0.5 mg via INTRAVENOUS

## 2015-06-04 MED ORDER — PHENYLEPHRINE HCL 10 % OP SOLN
OPHTHALMIC | Status: AC
  Administered 2015-06-04: 1 [drp] via OPHTHALMIC
  Filled 2015-06-04: qty 5

## 2015-06-04 MED ORDER — CEFUROXIME OPHTHALMIC INJECTION 1 MG/0.1 ML
INJECTION | OPHTHALMIC | Status: DC | PRN
Start: 2015-06-04 — End: 2015-06-04
  Administered 2015-06-04: 1 mg via INTRACAMERAL

## 2015-06-04 MED ORDER — NA CHONDROIT SULF-NA HYALURON 40-17 MG/ML IO SOLN
INTRAOCULAR | Status: AC
  Filled 2015-06-04: qty 2

## 2015-06-04 MED ORDER — MOXIFLOXACIN HCL 0.5 % OP SOLN
1.0000 [drp] | OPHTHALMIC | Status: AC | PRN
  Administered 2015-06-04 (×3): 1 [drp] via OPHTHALMIC

## 2015-06-04 MED ORDER — EPINEPHRINE HCL 1 MG/ML IJ SOLN
INTRAOCULAR | Status: DC | PRN
  Administered 2015-06-04: 200 mL via OPHTHALMIC

## 2015-06-04 MED ORDER — TETRACAINE HCL 0.5 % OP SOLN
OPHTHALMIC | Status: AC
  Filled 2015-06-04: qty 2

## 2015-06-04 MED ORDER — SODIUM CHLORIDE 0.9 % IV SOLN
INTRAVENOUS | Status: DC
  Administered 2015-06-04: 09:00:00 via INTRAVENOUS

## 2015-06-04 MED ORDER — TETRACAINE HCL 0.5 % OP SOLN
OPHTHALMIC | Status: DC | PRN
  Administered 2015-06-04: 2 [drp] via OPHTHALMIC

## 2015-06-04 MED ORDER — NA CHONDROIT SULF-NA HYALURON 40-17 MG/ML IO SOLN
INTRAOCULAR | Status: DC | PRN
  Administered 2015-06-04: 1 mL via INTRAOCULAR

## 2015-06-04 MED ORDER — CEFUROXIME OPHTHALMIC INJECTION 1 MG/0.1 ML
INJECTION | OPHTHALMIC | Status: AC
  Filled 2015-06-04: qty 0.1

## 2015-06-04 MED ORDER — PHENYLEPHRINE HCL 10 % OP SOLN
1.0000 [drp] | OPHTHALMIC | Status: AC | PRN
  Administered 2015-06-04 (×4): 1 [drp] via OPHTHALMIC

## 2015-06-04 MED ORDER — MOXIFLOXACIN HCL 0.5 % OP SOLN
OPHTHALMIC | Status: DC | PRN
  Administered 2015-06-04: 1 [drp] via OPHTHALMIC

## 2015-06-04 MED ORDER — HYALURONIDASE HUMAN 150 UNIT/ML IJ SOLN
INTRAMUSCULAR | Status: AC
  Filled 2015-06-04: qty 1

## 2015-06-04 MED ORDER — EPINEPHRINE HCL 1 MG/ML IJ SOLN
INTRAMUSCULAR | Status: AC
  Filled 2015-06-04: qty 2

## 2015-06-04 MED ORDER — LIDOCAINE HCL (PF) 4 % IJ SOLN
INTRAMUSCULAR | Status: DC | PRN
  Administered 2015-06-04: 5 mL via OPHTHALMIC

## 2015-06-04 MED ORDER — CYCLOPENTOLATE HCL 2 % OP SOLN
1.0000 [drp] | OPHTHALMIC | Status: AC | PRN
  Administered 2015-06-04 (×4): 1 [drp] via OPHTHALMIC

## 2015-06-04 SURGICAL SUPPLY — 31 items
CANNULA ANT/CHMB 27G (MISCELLANEOUS) ×1 IMPLANT
CANNULA ANT/CHMB 27GA (MISCELLANEOUS) ×3 IMPLANT
CORD BIP STRL DISP 12FT (MISCELLANEOUS) ×3 IMPLANT
CUP MEDICINE 2OZ PLAST GRAD ST (MISCELLANEOUS) ×3 IMPLANT
DRAPE XRAY CASSETTE 23X24 (DRAPES) ×3 IMPLANT
ERASER HMR WETFIELD 18G (MISCELLANEOUS) ×3 IMPLANT
GLOVE BIO SURGEON STRL SZ8 (GLOVE) ×3 IMPLANT
GLOVE SURG LX 6.5 MICRO (GLOVE) ×2
GLOVE SURG LX 8.0 MICRO (GLOVE) ×2
GLOVE SURG LX STRL 6.5 MICRO (GLOVE) ×1 IMPLANT
GLOVE SURG LX STRL 8.0 MICRO (GLOVE) ×1 IMPLANT
GOWN STRL REUS W/ TWL LRG LVL3 (GOWN DISPOSABLE) ×1 IMPLANT
GOWN STRL REUS W/ TWL XL LVL3 (GOWN DISPOSABLE) ×1 IMPLANT
GOWN STRL REUS W/TWL LRG LVL3 (GOWN DISPOSABLE) ×3
GOWN STRL REUS W/TWL XL LVL3 (GOWN DISPOSABLE) ×3
LENS IOL ACRSF IQ ULTRA 24.0 (Intraocular Lens) IMPLANT
LENS IOL ACRYSOF IQ 24.0 (Intraocular Lens) ×3 IMPLANT
PACK CATARACT (MISCELLANEOUS) ×3 IMPLANT
PACK CATARACT DINGLEDEIN LX (MISCELLANEOUS) ×3 IMPLANT
PACK EYE AFTER SURG (MISCELLANEOUS) ×3 IMPLANT
SHLD EYE VISITEC  UNIV (MISCELLANEOUS) ×3 IMPLANT
SOL BSS BAG (MISCELLANEOUS) ×3
SOL PREP PVP 2OZ (MISCELLANEOUS) ×3
SOLUTION BSS BAG (MISCELLANEOUS) ×1 IMPLANT
SOLUTION PREP PVP 2OZ (MISCELLANEOUS) ×1 IMPLANT
SUT SILK 5-0 (SUTURE) ×3 IMPLANT
SYR 3ML LL SCALE MARK (SYRINGE) ×3 IMPLANT
SYR 5ML LL (SYRINGE) ×3 IMPLANT
SYR TB 1ML 27GX1/2 LL (SYRINGE) ×3 IMPLANT
WATER STERILE IRR 1000ML POUR (IV SOLUTION) ×3 IMPLANT
WIPE NON LINTING 3.25X3.25 (MISCELLANEOUS) ×3 IMPLANT

## 2015-06-04 NOTE — Interval H&P Note (Signed)
History and Physical Interval Note:  06/04/2015 9:46 AM  Linda Juarez  has presented today for surgery, with the diagnosis of CATARACT  The various methods of treatment have been discussed with the patient and family. After consideration of risks, benefits and other options for treatment, the patient has consented to  Procedure(s): CATARACT EXTRACTION PHACO AND INTRAOCULAR LENS PLACEMENT ([place]) (Right) as a surgical intervention .  The patient's history has been reviewed, patient examined, no change in status, stable for surgery.  I have reviewed the patient's chart and labs.  Questions were answered to the patient's satisfaction.     Nayib Remer

## 2015-06-04 NOTE — Op Note (Signed)
Date of Surgery: 06/04/2015 Date of Dictation: 06/04/2015 10:25 AM Pre-operative Diagnosis:  Nuclear Sclerotic Cataract right Eye Post-operative Diagnosis: same Procedure performed: Extra-capsular Cataract Extraction (ECCE) with placement of a posterior chamber intraocular lens (IOL) right Eye IOL:  Implant Name Type Inv. Item Serial No. Manufacturer Lot No. LRB No. Used  LENS IOL ACRYSOF IQ 24.0 - [id number] Intraocular Lens LENS IOL ACRYSOF IQ 24.0 [id number] ALCON   Right 1   Anesthesia: 2% Lidocaine and 4% Marcaine in a 50/50 mixture with 10 unites/ml of Hylenex given as a peribulbar Anesthesiologist: Anesthesiologist: Molli Barrows, MD CRNA: Demetrius Charity, CRNA Complications: none Estimated Blood Loss: less than 1 ml  Description of procedure:  The patient was given anesthesia and sedation via intravenous access. The patient was then prepped and draped in the usual fashion. A 25-gauge needle was bent for initiating the capsulorhexis. A 5-0 silk suture was placed through the conjunctiva superior and inferiorly to serve as bridle sutures. Hemostasis was obtained at the superior limbus using an eraser cautery. A partial thickness groove was made at the anterior surgical limbus with a 64 Beaver blade and this was dissected anteriorly with an [employer]aya. The anterior chamber was entered at 10 o'clock with a 1.0 mm paracentesis knife and through the lamellar dissection with a 2.6 mm Alcon keratome. Epi-Shugarcaine 0.5 CC [9 cc BSS Plus (Alcon), 3 cc 4% preservative-free lidocaine (Hospira) and 4 cc 1:1000 preservative-free, bisulfite-free epinephrine] was injected into the anterior chamber via the paracentesis tract. Epi-Shugarcaine 0.5 CC [9 cc BSS Plus (Alcon), 3 cc 4% preservative-free lidocaine (Hospira) and 4 cc 1:1000 preservative-free, bisulfite-free epinephrine] was injected into the anterior chamber via the paracentesis tract. DiscoVisc was injected to replace the aqueous and a  continuous tear curvilinear capsulorhexis was performed using a bent 25-gauge needle.  Balance salt on a syringe was used to perform hydro-dissection and phacoemulsification was carried out using a divide and conquer technique. Procedure(s) with comments: CATARACT EXTRACTION PHACO AND INTRAOCULAR LENS PLACEMENT (IOC) (Right) - LOT# [id number] H [place]: 01:39.5 AP: 25.9% CDE: 42.63. Irrigation/aspiration was used to remove the residual cortex and the capsular bag was inflated with DiscoVisc. The intraocular lens was inserted into the capsular bag using a pre-loaded UltraSert Delivery System. Irrigation/aspiration was used to remove the residual DiscoVisc. The wound was inflated with balanced salt and checked for leaks. None were found. Miostat was injected via the paracentesis track and 0.1 ml of cefuroxime containing 1 mg of drug  was injected via the paracentesis track. The wound was checked for leaks again and none were found.   The bridal sutures were removed and two drops of Vigamox were placed on the eye. An eye shield was placed to protect the eye and the patient was discharged to the recovery area in good condition.   Darroll Bredeson MD

## 2015-06-04 NOTE — Anesthesia Postprocedure Evaluation (Signed)
  Anesthesia Post-op Note  Patient: Linda Juarez  Procedure(s) Performed: Procedure(s) with comments: CATARACT EXTRACTION PHACO AND INTRAOCULAR LENS PLACEMENT (IOC) (Right) - LOT# [id number] H [place]: 01:39.5 AP: 25.9% CDE: 42.63  Anesthesia type:MAC  Patient location: PACU  Post pain: Pain level controlled  Post assessment: Post-op Vital signs reviewed, Patient's Cardiovascular Status Stable, Respiratory Function Stable, Patent Airway and No signs of Nausea or vomiting  Post vital signs: Reviewed and stable  Last Vitals:  Filed Vitals:   06/04/15 1028  BP: 165/90  Pulse: 60  Resp: 18    Level of consciousness: awake, alert  and patient cooperative  Complications: No apparent anesthesia complications

## 2015-06-04 NOTE — Anesthesia Preprocedure Evaluation (Signed)
Anesthesia Evaluation  Patient identified by MRN, date of birth, ID band Patient awake    Reviewed: Allergy & Precautions, H&P , NPO status , Patient's Chart, lab work & pertinent test results, reviewed documented beta blocker date and time   Airway Mallampati: II  TM Distance: >3 FB Neck ROM: full    Dental no notable dental hx. (+) Teeth Intact, Implants   Pulmonary neg pulmonary ROS, former smoker,    Pulmonary exam normal breath sounds clear to auscultation       Cardiovascular Exercise Tolerance: Good negative cardio ROS   Rhythm:regular Rate:Normal     Neuro/Psych negative neurological ROS  negative psych ROS   GI/Hepatic negative GI ROS, Neg liver ROS, GERD  ,  Endo/Other  negative endocrine ROS  Renal/GU negative Renal ROS  negative genitourinary   Musculoskeletal   Abdominal   Peds  Hematology negative hematology ROS (+)   Anesthesia Other Findings   Reproductive/Obstetrics negative OB ROS                             Anesthesia Physical Anesthesia Plan  ASA: III  Anesthesia Plan: MAC   Post-op Pain Management:    Induction:   Airway Management Planned:   Additional Equipment:   Intra-op Plan:   Post-operative Plan:   Informed Consent: I have reviewed the patients History and Physical, chart, labs and discussed the procedure including the risks, benefits and alternatives for the proposed anesthesia with the patient or authorized representative who has indicated his/her understanding and acceptance.   Dental Advisory Given  Plan Discussed with: CRNA  Anesthesia Plan Comments:         Anesthesia Quick Evaluation

## 2015-06-04 NOTE — Anesthesia Procedure Notes (Signed)
Procedure Name: MAC Performed by: Jarrin Staley Pre-anesthesia Checklist: Patient identified, Emergency Drugs available, Suction available, Patient being monitored and Timeout performed Oxygen Delivery Method: Nasal cannula       

## 2015-06-04 NOTE — Discharge Instructions (Addendum)
See handout.AMBULATORY SURGERY  DISCHARGE INSTRUCTIONS   1) The drugs that you were given will stay in your system until tomorrow so for the next 24 hours you should not:  A) Drive an automobile B) Make any legal decisions C) Drink any alcoholic beverage   2) You may resume regular meals tomorrow.  Today it is better to start with liquids and gradually work up to solid foods.  You may eat anything you prefer, but it is better to start with liquids, then soup and crackers, and gradually work up to solid foods.   3) Please notify your doctor immediately if you have any unusual bleeding, trouble breathing, redness and pain at the surgery site, drainage, fever, or pain not relieved by medication.    4) Additional Instructions:    Eye Surgery Discharge Instructions  Expect mild scratchy sensation or mild soreness. DO NOT RUB YOUR EYE!  The day of surgery:  Minimal physical activity, but bed rest is not required  No reading, computer work, or close hand work  No bending, lifting, or straining.  May watch TV  For 24 hours:  No driving, legal decisions, or alcoholic beverages  Safety precautions  Eat anything you prefer: It is better to start with liquids, then soup then solid foods.  _____ Eye patch should be worn until postoperative exam tomorrow.  ____ Solar shield eyeglasses should be worn for comfort in the sunlight/patch while sleeping  Resume all regular medications including aspirin or Coumadin if these were discontinued prior to surgery. You may shower, bathe, shave, or wash your hair. Tylenol may be taken for mild discomfort.  Call your doctor if you experience significant pain, nausea, or vomiting, fever > 101 or other signs of infection. [phone] or [phone] Specific instructions:  Follow-up Information    Follow up with DINGELDEIN,STEVEN, MD In 1 day.   Specialty:  Ophthalmology   Why:  September 13 at 10:10am   Contact information:   [address]   [place] [address] [phone]         Please contact your physician with any problems or Same Day Surgery at [phone], Monday through Friday 6 am to 4 pm, or [place] at [facility] number at [phone].

## 2015-06-04 NOTE — Transfer of Care (Signed)
Immediate Anesthesia Transfer of Care Note  Patient: Linda Juarez  Procedure(s) Performed: Procedure(s) with comments: CATARACT EXTRACTION PHACO AND INTRAOCULAR LENS PLACEMENT (IOC) (Right) - LOT# [id number] H [place]: 01:39.5 AP: 25.9% CDE: 42.63  Patient Location: PACU  Anesthesia Type:MAC  Level of Consciousness: awake, alert  and oriented  Airway & Oxygen Therapy: Patient Spontanous Breathing  Post-op Assessment: Report given to RN and Post -op Vital signs reviewed and stable  Post vital signs: Reviewed and stable  Last Vitals:  Filed Vitals:   06/04/15 1028  BP: 165/90  Pulse: 60  Resp: 18    Complications: No apparent anesthesia complications

## 2015-08-14 ENCOUNTER — Telehealth: Payer: Self-pay | Admitting: Family Medicine

## 2015-08-14 DIAGNOSIS — E785 Hyperlipidemia, unspecified: Secondary | ICD-10-CM

## 2015-08-14 NOTE — Telephone Encounter (Signed)
Pt called wanting to have her chol checked can she come before her appointment 10/09/15

## 2015-08-15 NOTE — Telephone Encounter (Signed)
Yes, order is in.  Thanks.

## 2015-08-20 NOTE — Telephone Encounter (Signed)
Appointment 1/12 pt aware

## 2015-08-31 DIAGNOSIS — D485 Neoplasm of uncertain behavior of skin: Secondary | ICD-10-CM | POA: Diagnosis not present

## 2015-08-31 DIAGNOSIS — L718 Other rosacea: Secondary | ICD-10-CM | POA: Diagnosis not present

## 2015-08-31 DIAGNOSIS — Z1283 Encounter for screening for malignant neoplasm of skin: Secondary | ICD-10-CM | POA: Diagnosis not present

## 2015-09-30 DIAGNOSIS — Z86018 Personal history of other benign neoplasm: Secondary | ICD-10-CM

## 2015-09-30 HISTORY — DX: Personal history of other benign neoplasm: Z86.018

## 2015-10-02 ENCOUNTER — Telehealth: Payer: Self-pay | Admitting: Family Medicine

## 2015-10-02 NOTE — Telephone Encounter (Signed)
Linda Juarez, Did you try to call Alesia Morin?

## 2015-10-02 NOTE — Telephone Encounter (Signed)
Patient returned Shapale's call.  She said if it's about her biopsy results, she's already been notified and Shapale doesn't need to call patient back.

## 2015-10-02 NOTE — Telephone Encounter (Signed)
I see no record of having called or of anyone else having called this patient.

## 2015-10-02 NOTE — Telephone Encounter (Signed)
Patient has lab work scheduled on 10/04/15.  Patient lives in [place] and wants to know if she can get a lab order and have the lab work done at [facility].

## 2015-10-02 NOTE — Telephone Encounter (Signed)
I never called pt and this isn't Dr. Marliss Coots pt, routed back to [facility]

## 2015-10-03 NOTE — Telephone Encounter (Signed)
Pt called back has the been sent to [facility]  Pt wants to go tomorrow Please advise [facility] number [phone]

## 2015-10-03 NOTE — Telephone Encounter (Signed)
Called patient back to find out what [facility] to send the order to. Patient stated that she has an address, but does not know where to fax it to. Patient said to disregard the request and she will come to the office since the roads are fine. Patient stated that she already has a lab appointment scheduled in the morning.

## 2015-10-03 NOTE — Telephone Encounter (Signed)
Pt called back and wants to make sure that the order is sent to [facility]  [phone]

## 2015-10-03 NOTE — Telephone Encounter (Signed)
Order for cmet and lipid done, dx E78.5.

## 2015-10-04 ENCOUNTER — Other Ambulatory Visit (INDEPENDENT_AMBULATORY_CARE_PROVIDER_SITE_OTHER): Payer: Medicare Other

## 2015-10-04 DIAGNOSIS — E785 Hyperlipidemia, unspecified: Secondary | ICD-10-CM

## 2015-10-04 LAB — LIPID PANEL
Cholesterol: 223 mg/dL — ABNORMAL HIGH (ref 0–200)
HDL: 46.1 mg/dL (ref >39.00)
LDL CALC: 142 mg/dL — AB (ref 0–99)
NONHDL: 176.44
Total CHOL/HDL Ratio: 5
Triglycerides: 172 mg/dL — ABNORMAL HIGH (ref 0.0–149.0)
VLDL: 34.4 mg/dL (ref 0.0–40.0)

## 2015-10-09 ENCOUNTER — Ambulatory Visit (INDEPENDENT_AMBULATORY_CARE_PROVIDER_SITE_OTHER)
Admission: RE | Admit: 2015-10-09 | Discharge: 2015-10-09 | Disposition: A | Discharge status: Home / Self Care | Payer: Medicare Other | Source: Ambulatory Visit | Attending: Family Medicine | Admitting: Family Medicine

## 2015-10-09 ENCOUNTER — Encounter: Payer: Self-pay | Admitting: Family Medicine

## 2015-10-09 ENCOUNTER — Ambulatory Visit (INDEPENDENT_AMBULATORY_CARE_PROVIDER_SITE_OTHER): Payer: Medicare Other | Admitting: Family Medicine

## 2015-10-09 VITALS — BP 130/70 | HR 64 | Temp 97.8°F | Wt 167.8 lb

## 2015-10-09 DIAGNOSIS — E785 Hyperlipidemia, unspecified: Secondary | ICD-10-CM | POA: Diagnosis not present

## 2015-10-09 DIAGNOSIS — M25552 Pain in left hip: Secondary | ICD-10-CM | POA: Diagnosis not present

## 2015-10-09 DIAGNOSIS — Z23 Encounter for immunization: Secondary | ICD-10-CM | POA: Diagnosis not present

## 2015-10-09 DIAGNOSIS — M25551 Pain in right hip: Secondary | ICD-10-CM

## 2015-10-09 NOTE — Patient Instructions (Signed)
Go to the lab on the way out.  We'll contact you with your xray report. Take tylenol in the meantime as needed.   Take care.  Glad to see you.

## 2015-10-09 NOTE — Assessment & Plan Note (Signed)
Likely OA with occ flare of trochanteric bursitis.  D/w pt.  Would check plain films, tylenol in meantime.  See notes on imaging.

## 2015-10-09 NOTE — Progress Notes (Signed)
Pre visit review using our clinic review tool, if applicable. No additional management support is needed unless otherwise documented below in the visit note.  Off statin.  Labs d/w pt.  At this point, other than her lipid levels, she doesn't appear to have a compelling indication for statin tx (ie no CAD/CVA/etc).  D/w pt about diet and exercise and she prefers to concentrate on that.    Pain sleeping on L side, with pain at the L hip, hear the greater trochanter.  Does better sleeping on R side.  Now more recently with pain initial steps walking but then improves with continued walking, with pain in the groin B.  Not clearly worse with weather changes.  She fell in early 08/2015 in a parking lot and didn't know if that contributed.  No pain with sitting.    Meds, vitals, and allergies reviewed.   ROS: See HPI.  Otherwise, noncontributory.  GEN: nad, alert and oriented HEENT: mucous membranes moist NECK: supple w/o LA CV: rrr PULM: ctab, no inc wob EXT: no edema B hip pain with ROM, with int/ext rotation.  No weakness BLE.

## 2015-10-09 NOTE — Assessment & Plan Note (Signed)
reasonable to continue off statin.

## 2016-07-03 ENCOUNTER — Ambulatory Visit (INDEPENDENT_AMBULATORY_CARE_PROVIDER_SITE_OTHER): Payer: Medicare Other | Admitting: Family Medicine

## 2016-07-03 ENCOUNTER — Encounter: Payer: Self-pay | Admitting: Family Medicine

## 2016-07-03 VITALS — BP 116/70 | HR 78 | Temp 98.5°F | Wt 167.5 lb

## 2016-07-03 DIAGNOSIS — R197 Diarrhea, unspecified: Secondary | ICD-10-CM

## 2016-07-03 MED ORDER — CIPROFLOXACIN HCL 500 MG PO TABS: 500.0000 mg | ORAL_TABLET | Freq: Two times a day (BID) | ORAL | 0 refills | Status: DC

## 2016-07-03 MED ORDER — METRONIDAZOLE 500 MG PO TABS: 500.0000 mg | ORAL_TABLET | Freq: Three times a day (TID) | ORAL | 0 refills | Status: DC

## 2016-07-03 NOTE — Patient Instructions (Signed)
Go to the lab on the way out.  We'll contact you with your lab report. Clear liquid diet for now.  Avoid dairy.   Take OTC imodium 2-3 times a day if needed.  If more abdominal pain, if getting worse overall, then update [place] and start cipro/flagyl. I think this is likely a GI bug and not diverticulitis- hold the antibiotics for now.   I think you'll likely not need the antibiotics.   Take care.  Glad to see you.

## 2016-07-03 NOTE — Progress Notes (Signed)
Pre visit review using our clinic review tool, if applicable. No additional management support is needed unless otherwise documented below in the visit note. 

## 2016-07-03 NOTE — Progress Notes (Signed)
She wanted to put off her flu shot.  D/w pt.    Started about 2 days ago.  Went out to eat.  Then had abd bloating.  She felt gradually worse.  Yesterday she started vomiting.  Last night she was fatigued.  Then last night started having diarrhea.  No more vomiting.  Fecal urgency.  2-3 BMs this afternoon, is tapering off.  abd is still sore.  H/o diverticulitis in the past.  Her abd pain is better now.  No fevers.    Taking advil at baseline for hip pain and she is able to get by with that.    Meds, vitals, and allergies reviewed.   ROS: Per HPI unless specifically indicated in ROS section   GEN: nad, alert and oriented, well appearing.  HEENT: mucous membranes moist NECK: supple w/o LA CV: rrr. PULM: ctab, no inc wob ABD: soft, +bs, minimally ttp but no rebound.  EXT: no edema

## 2016-07-04 DIAGNOSIS — R197 Diarrhea, unspecified: Secondary | ICD-10-CM | POA: Insufficient documentation

## 2016-07-04 LAB — COMPREHENSIVE METABOLIC PANEL
ALT: 32 U/L (ref 0–35)
AST: 31 U/L (ref 0–37)
Albumin: 4.5 g/dL (ref 3.5–5.2)
Alkaline Phosphatase: 96 U/L (ref 39–117)
BUN: 23 mg/dL (ref 6–23)
CO2: 28 mEq/L (ref 19–32)
Calcium: 9.9 mg/dL (ref 8.4–10.5)
Chloride: 103 mEq/L (ref 96–112)
Creatinine, Ser: 1.07 mg/dL (ref 0.40–1.20)
GFR: 52.14 mL/min — ABNORMAL LOW (ref >60.00)
Glucose, Bld: 83 mg/dL (ref 70–99)
Potassium: 4.8 mEq/L (ref 3.5–5.1)
Sodium: 141 mEq/L (ref 135–145)
Total Bilirubin: 0.7 mg/dL (ref 0.2–1.2)
Total Protein: 7.4 g/dL (ref 6.0–8.3)

## 2016-07-04 LAB — CBC WITH DIFFERENTIAL/PLATELET
BASOS PCT: 0.5 % (ref 0.0–3.0)
Basophils Absolute: 0.1 10*3/uL (ref 0.0–0.1)
Eosinophils Absolute: 0.1 10*3/uL (ref 0.0–0.7)
Eosinophils Relative: 0.7 % (ref 0.0–5.0)
HEMATOCRIT: 44.5 % (ref 36.0–46.0)
Hemoglobin: 15.1 g/dL — ABNORMAL HIGH (ref 12.0–15.0)
LYMPHS PCT: 15.9 % (ref 12.0–46.0)
Lymphs Abs: 1.7 10*3/uL (ref 0.7–4.0)
MCHC: 33.9 g/dL (ref 30.0–36.0)
MCV: 90.6 fl (ref 78.0–100.0)
MONOS PCT: 4.7 % (ref 3.0–12.0)
Monocytes Absolute: 0.5 10*3/uL (ref 0.1–1.0)
NEUTROS ABS: 8.4 10*3/uL — AB (ref 1.4–7.7)
Neutrophils Relative %: 78.2 % — ABNORMAL HIGH (ref 43.0–77.0)
PLATELETS: 239 10*3/uL (ref 150.0–400.0)
RBC: 4.91 Mil/uL (ref 3.87–5.11)
RDW: 13.7 % (ref 11.5–15.5)
WBC: 10.8 10*3/uL — ABNORMAL HIGH (ref 4.0–10.5)

## 2016-07-04 NOTE — Assessment & Plan Note (Signed)
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  D/w pt.  She agrees.   Clear liquid diet for now.  Avoid dairy.   Take OTC imodium 2-3 times a day if needed.  If more abdominal pain, if getting worse overall, then she'll update [place] and start cipro/flagyl- hold the antibiotics for now.   I think she'll likely not need the antibiotics.   She agrees with plan.

## 2016-07-07 ENCOUNTER — Encounter: Payer: Self-pay | Admitting: *Deleted

## 2016-07-14 ENCOUNTER — Telehealth: Payer: Self-pay

## 2016-07-14 NOTE — Telephone Encounter (Signed)
Pt was seen 07/03/16; pt took abx for 8 days and stopped abx on 07/13/16 due to nausea.Pt feels better.Pt want to know if can get scan or xray on stomach since had so much problem recently with stomach. Pt also wants to know if should be taking probiotic. Pt request cb.

## 2016-07-15 NOTE — Telephone Encounter (Signed)
Left detailed message on voicemail.  

## 2016-07-15 NOTE — Telephone Encounter (Signed)
Reasonable to take probiotic.  Usually we don't scan people when they have improved as it isn't work the radiation exposure.  If more sx, then we should consider imaging vs referral to GI.  Let me know if this doesn't make sense to her. Thanks.

## 2016-09-18 ENCOUNTER — Other Ambulatory Visit: Payer: Self-pay | Admitting: Family Medicine

## 2016-09-18 ENCOUNTER — Ambulatory Visit (INDEPENDENT_AMBULATORY_CARE_PROVIDER_SITE_OTHER): Payer: Medicare Other

## 2016-09-18 VITALS — BP 122/80 | HR 60 | Temp 98.0°F | Ht 65.5 in | Wt 165.5 lb

## 2016-09-18 DIAGNOSIS — Z0000 Encounter for general adult medical examination without abnormal findings: Secondary | ICD-10-CM

## 2016-09-18 DIAGNOSIS — E785 Hyperlipidemia, unspecified: Secondary | ICD-10-CM | POA: Diagnosis not present

## 2016-09-18 DIAGNOSIS — Z23 Encounter for immunization: Secondary | ICD-10-CM

## 2016-09-18 LAB — LIPID PANEL
Cholesterol: 219 mg/dL — ABNORMAL HIGH (ref 0–200)
HDL: 39.4 mg/dL (ref >39.00)
NONHDL: 180.02
Total CHOL/HDL Ratio: 6
Triglycerides: 273 mg/dL — ABNORMAL HIGH (ref 0.0–149.0)
VLDL: 54.6 mg/dL — ABNORMAL HIGH (ref 0.0–40.0)

## 2016-09-18 LAB — LDL CHOLESTEROL, DIRECT: LDL DIRECT: 121 mg/dL

## 2016-09-18 NOTE — Progress Notes (Signed)
PCP notes:   Health maintenance:  Flu vaccine - administered  Abnormal screenings:   Fall risk - hx of fall without injury   Patient concerns:   None  Nurse concerns:  None  Next PCP appt:   09/29/16 @ [id number]  I reviewed health advisor's note, was available for consultation on the day of service listed in this note, and agree with documentation and plan. Elsie Stain, MD.

## 2016-09-18 NOTE — Patient Instructions (Signed)
Linda Juarez , Thank you for taking time to come for your Medicare Wellness Visit. I appreciate your ongoing commitment to your health goals. Please review the following plan we discussed and let me know if I can assist you in the future.   These are the goals we discussed: Goals    . Increase water intake          Starting 09/19/2016, I will attempt to drink at least 6-8 glasses of water daily.        This is a list of the screening recommended for you and due dates:  Health Maintenance  Topic Date Due  . Tetanus Vaccine  12/24/2022  . Flu Shot  Completed  . DEXA scan (bone density measurement)  Completed  . Shingles Vaccine  Completed  . Pneumonia vaccines  Completed   Preventive Care for Adults  A healthy lifestyle and preventive care can promote health and wellness. Preventive health guidelines for adults include the following key practices.  . A routine yearly physical is a good way to check with your health care provider about your health and preventive screening. It is a chance to share any concerns and updates on your health and to receive a thorough exam.  . Visit your dentist for a routine exam and preventive care every 6 months. Brush your teeth twice a day and floss once a day. Good oral hygiene prevents tooth decay and gum disease.  . The frequency of eye exams is based on your age, health, family medical history, use  of contact lenses, and other factors. Follow your health care provider's ecommendations for frequency of eye exams.  . Eat a healthy diet. Foods like vegetables, fruits, whole grains, low-fat dairy products, and lean protein foods contain the nutrients you need without too many calories. Decrease your intake of foods high in solid fats, added sugars, and salt. Eat the right amount of calories for you. Get information about a proper diet from your health care provider, if necessary.  . Regular physical exercise is one of the most important things you can do  for your health. Most adults should get at least 150 minutes of moderate-intensity exercise (any activity that increases your heart rate and causes you to sweat) each week. In addition, most adults need muscle-strengthening exercises on 2 or more days a week.  Silver Sneakers may be a benefit available to you. To determine eligibility, you may visit the website: www.silversneakers.com or contact program at (862)103-9590 Mon-Fri between 8AM-8PM.   . Maintain a healthy weight. The body mass index (BMI) is a screening tool to identify possible weight problems. It provides an estimate of body fat based on height and weight. Your health care provider can find your BMI and can help you achieve or maintain a healthy weight.   For adults 20 years and older: ? A BMI below 18.5 is considered underweight. ? A BMI of 18.5 to 24.9 is normal. ? A BMI of 25 to 29.9 is considered overweight. ? A BMI of 30 and above is considered obese.   . Maintain normal blood lipids and cholesterol levels by exercising and minimizing your intake of saturated fat. Eat a balanced diet with plenty of fruit and vegetables. Blood tests for lipids and cholesterol should begin at age 58 and be repeated every 5 years. If your lipid or cholesterol levels are high, you are over 50, or you are at high risk for heart disease, you may need your cholesterol levels checked more  frequently. Ongoing high lipid and cholesterol levels should be treated with medicines if diet and exercise are not working.  . If you smoke, find out from your health care provider how to quit. If you do not use tobacco, please do not start.  . If you choose to drink alcohol, please do not consume more than 2 drinks per day. One drink is considered to be 12 ounces (355 mL) of beer, 5 ounces (148 mL) of wine, or 1.5 ounces (44 mL) of liquor.  . If you are 24-76 years old, ask your health care provider if you should take aspirin to prevent strokes.  . Use sunscreen.  Apply sunscreen liberally and repeatedly throughout the day. You should seek shade when your shadow is shorter than you. Protect yourself by wearing long sleeves, pants, a wide-brimmed hat, and sunglasses year round, whenever you are outdoors.  . Once a month, do a whole body skin exam, using a mirror to look at the skin on your back. Tell your health care provider of new moles, moles that have irregular borders, moles that are larger than a pencil eraser, or moles that have changed in shape or color.

## 2016-09-18 NOTE — Progress Notes (Signed)
Pre visit review using our clinic review tool, if applicable. No additional management support is needed unless otherwise documented below in the visit note. 

## 2016-09-18 NOTE — Progress Notes (Signed)
Subjective:   Linda Juarez is a 82 y.o. female who presents for Medicare Annual (Subsequent) preventive examination.  Review of Systems:  N/A Cardiac Risk Factors include: advanced age (>77men, >3 women);dyslipidemia;sedentary lifestyle     Objective:     Vitals: BP 122/80 (BP Location: Left Arm, Patient Position: Sitting, Cuff Size: Normal)   Pulse 60   Temp 98 F (36.7 C) (Oral)   Ht 5' 5.5" (1.664 m) Comment: no shoes  Wt 165 lb 8 oz (75.1 kg)   SpO2 98%   BMI 27.12 kg/m   Body mass index is 27.12 kg/m.   Tobacco History  Smoking Status  . Former Smoker  . Packs/day: 0.50  . Types: Cigarettes  . Quit date: 09/22/1986  Smokeless Tobacco  . Never Used     Counseling given: No   Past Medical History:  Diagnosis Date  . 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 Arthritis    Osteoarthritis  . Diverticulitis   . Diverticulosis of colon without hemorrhage   . GERD (gastroesophageal reflux disease)   . HOH (hard of hearing)   . Hyperlipidemia    Past Surgical History:  Procedure Laterality Date  . ABDOMINAL HYSTERECTOMY  1980's  . CATARACT EXTRACTION Left 2015  . CATARACT EXTRACTION W/PHACO Right 06/04/2015   Procedure: CATARACT EXTRACTION PHACO AND INTRAOCULAR LENS PLACEMENT ([place]);  Surgeon: Estill Cotta, MD;  Location: [facility] ORS;  Service: Ophthalmology;  Laterality: Right;  LOT# [id number] H [place]: 01:39.5 AP: 25.9% CDE: 42.63  . CHOLECYSTECTOMY  2006  . Stress Cardiolite  04/24/11   Normal perfusion study with no stress induced ischemia  . TONSILLECTOMY AND ADENOIDECTOMY     Family History  Problem Relation Age of Onset  . Stroke Mother   . COPD Sister   . Thyroid disease Sister   . Heart disease Brother   . Thyroid disease Other   . Colon cancer Neg Hx   . Colon polyps Neg Hx   . Diabetes Neg Hx   . Kidney disease Neg Hx   . Esophageal cancer Neg Hx   . Breast cancer Neg Hx     History  Sexual Activity  . Sexual activity: Not on file    Outpatient Encounter Prescriptions as of 09/18/2016  Medication Sig  . acetaminophen (TYLENOL) 500 MG tablet Take 1,000 mg by mouth 2 (two) times daily.  . Garlic (GARLIQUE) [id number] MG TBEC Take 1 tablet by mouth daily.   Marland Kitchen ibuprofen (ADVIL) 200 MG tablet Take 200 mg by mouth 3 (three) times daily.   . Multiple Vitamins-Minerals (CENTRUM SILVER ADULT 50+) TABS Take 1 tablet by mouth as needed.   . Probiotic Product (PROBIOTIC DAILY PO) Take 1 tablet by mouth.  . [DISCONTINUED] aspirin 81 MG tablet Take 81 mg by mouth daily.  . [DISCONTINUED] Cholecalciferol (VITAMIN D) 2000 units CAPS Take 1 capsule by mouth daily.  . [DISCONTINUED] ciprofloxacin (CIPRO) 500 MG tablet Take 1 tablet (500 mg total) by mouth 2 (two) times daily.  . [DISCONTINUED] metroNIDAZOLE (FLAGYL) 500 MG tablet Take 1 tablet (500 mg total) by mouth 3 (three) times daily.   No facility-administered encounter medications on file as of 09/18/2016.     Activities of Daily Living In your present state of health, do you have any difficulty performing the following activities: 09/18/2016  Hearing? Y  Vision? N  Difficulty concentrating or making decisions? Y  Walking or  climbing stairs? N  Dressing or bathing? N  Doing errands, shopping? N  Preparing Food and eating ? N  Using the Toilet? N  In the past six months, have you accidently leaked urine? Y  Do you have problems with loss of bowel control? N  Managing your Medications? N  Managing your Finances? N  Housekeeping or managing your Housekeeping? N  Some recent data might be hidden    Patient Care Team: Tonia Ghent, MD as PCP - General (Family Medicine)    Assessment:      Visual Acuity Screening   Right eye Left eye Both eyes  Without correction: 20/20 20/25-1 20/20  With correction:     Hearing Screening Comments: Bilateral hearing aids   Exercise Activities and Dietary  recommendations Current Exercise Habits: The patient does not participate in regular exercise at present, Exercise limited by: None identified  Goals    . Increase water intake          Starting 09/19/2016, I will attempt to drink at least 6-8 glasses of water daily.       Fall Risk Fall Risk  09/18/2016 01/26/2015  Falls in the past year? Yes No  Number falls in past yr: 1 -  Injury with Fall? No -   Depression Screen PHQ 2/9 Scores 09/18/2016 01/26/2015  PHQ - 2 Score 0 0     Cognitive Function MMSE - Mini Mental State Exam 09/18/2016  Orientation to time 5  Orientation to Place 5  Registration 3  Attention/ Calculation 0  Recall 3  Language- name 2 objects 0  Language- repeat 1  Language- follow 3 step command 3  Language- read & follow direction 0  Write a sentence 0  Copy design 0  Total score 20   PLEASE NOTE: A Mini-Cog screen was completed. Maximum score is 20. A value of 0 denotes this part of [place] MMSE was not completed or the patient failed this part of the Mini-Cog screening.   Mini-Cog Screening Orientation to Time - Max 5 pts Orientation to Place - Max 5 pts Registration - Max 3 pts Recall - Max 3 pts Language Repeat - Max 1 pts Language Follow 3 Step Command - Max 3 pts       Immunization History  Administered Date(s) Administered  . Influenza Whole 07/28/2013  . Influenza,inj,Quad PF,36+ Mos 10/09/2015  . Influenza-Unspecified 12/25/2014  . Pneumococcal Conjugate-13 01/26/2015  . Pneumococcal Polysaccharide-23 12/23/2012  . Td 12/23/2012  . Zoster 03/29/2009   Screening Tests Health Maintenance  Topic Date Due  . TETANUS/TDAP  12/24/2022  . INFLUENZA VACCINE  Completed  . DEXA SCAN  Completed  . ZOSTAVAX  Completed  . PNA vac Low Risk Adult  Completed      Plan:     I have personally reviewed and addressed the Medicare Annual Wellness questionnaire and have noted the following in the patient's chart:  A. Medical and social  history B. Use of alcohol, tobacco or illicit drugs  C. Current medications and supplements D. Functional ability and status E.  Nutritional status F.  Physical activity G. Advance directives H. List of other physicians I.  Hospitalizations, surgeries, and ER visits in previous 12 months J.  [place] to include hearing, vision, cognitive, depression L. Referrals and appointments - none  In addition, I have reviewed and discussed with patient certain preventive protocols, quality metrics, and best practice recommendations. A written personalized care plan for preventive services as well  as general preventive health recommendations were provided to patient.  See attached scanned questionnaire for additional information.   Signed,   Lindell Noe, MHA, BS, LPN Health Coach

## 2016-09-29 ENCOUNTER — Encounter: Payer: Self-pay | Admitting: Family Medicine

## 2016-09-29 ENCOUNTER — Ambulatory Visit (INDEPENDENT_AMBULATORY_CARE_PROVIDER_SITE_OTHER): Payer: Medicare Other | Admitting: Family Medicine

## 2016-09-29 VITALS — BP 146/82 | HR 63 | Temp 97.8°F | Ht 66.0 in | Wt 166.2 lb

## 2016-09-29 DIAGNOSIS — Z1231 Encounter for screening mammogram for malignant neoplasm of breast: Secondary | ICD-10-CM | POA: Diagnosis not present

## 2016-09-29 DIAGNOSIS — Z0000 Encounter for general adult medical examination without abnormal findings: Secondary | ICD-10-CM | POA: Diagnosis not present

## 2016-09-29 DIAGNOSIS — E2839 Other primary ovarian failure: Secondary | ICD-10-CM | POA: Diagnosis not present

## 2016-09-29 DIAGNOSIS — M25559 Pain in unspecified hip: Secondary | ICD-10-CM | POA: Diagnosis not present

## 2016-09-29 DIAGNOSIS — M25551 Pain in right hip: Secondary | ICD-10-CM

## 2016-09-29 DIAGNOSIS — E785 Hyperlipidemia, unspecified: Secondary | ICD-10-CM

## 2016-09-29 DIAGNOSIS — M25552 Pain in left hip: Secondary | ICD-10-CM

## 2016-09-29 NOTE — Assessment & Plan Note (Signed)
Refer to PT.  Prev imaging d/w pt.

## 2016-09-29 NOTE — Assessment & Plan Note (Signed)
DXA and mammogram due.   Fall risk - hx of fall without injury.  See below re: PT.

## 2016-09-29 NOTE — Progress Notes (Signed)
Pre visit review using our clinic review tool, if applicable. No additional management support is needed unless otherwise documented below in the visit note. 

## 2016-09-29 NOTE — Assessment & Plan Note (Signed)
Likely benefit from statin is low.  She'll work on exercise to help with TG.  D/w pt.   She agrees.  >25 minutes spent in face to face time with patient, >50% spent in counselling or coordination of care.

## 2016-09-29 NOTE — Patient Instructions (Addendum)
Rosaria Ferries will call about your referrals.  See her on the way out.  Take care.  Glad to see you.  Update me as needed.

## 2016-09-29 NOTE — Progress Notes (Signed)
Advance directive d/w pt- niece Rita Ohara designated if patient were incapacitated.  DXA and mammogram due.   Fall risk - hx of fall without injury.  See below re: PT.    She moved into a new place after the death of her husband.  She is adjusting to the change.  She can move to ALF in the future if needed, but she is still independent for now.  D/w pt.  She feel security in her current situation.    OA.  Pain partially controlled with tylenol and low dose ibuprofen.  She was asking about PT.  Prev imaging done with OA in the hips.  D/w pt.    Elevated Cholesterol: No meds Diet compliance: yes Exercise: see above, limited by pain.   Labs d/w pt.  She didn't have a lot of aches from statin prev.  She still has some aches off statin now.  She'll work on exercise to help with TG.  D/w pt.    PMH and SH reviewed  ROS: Per HPI unless specifically indicated in ROS section   Meds, vitals, and allergies reviewed.   GEN: nad, alert and oriented HEENT: mucous membranes moist NECK: supple w/o LA CV: rrr.  no murmur PULM: ctab, no inc wob ABD: soft, +bs EXT: no edema SKIN: no acute rash Hips with B ROM pain.

## 2016-10-28 ENCOUNTER — Encounter: Payer: Self-pay | Admitting: Family Medicine

## 2016-12-10 ENCOUNTER — Encounter
Admission: RE | Admit: 2016-12-10 | Discharge: 2016-12-10 | Disposition: A | Discharge status: Home / Self Care | Payer: Medicare Other | Source: Ambulatory Visit | Attending: Orthopedic Surgery | Admitting: Orthopedic Surgery

## 2016-12-10 DIAGNOSIS — Z01812 Encounter for preprocedural laboratory examination: Secondary | ICD-10-CM | POA: Diagnosis not present

## 2016-12-10 DIAGNOSIS — Z01810 Encounter for preprocedural cardiovascular examination: Secondary | ICD-10-CM | POA: Diagnosis present

## 2016-12-10 LAB — TYPE AND SCREEN
ABO/RH(D): AB NEG
Antibody Screen: NEGATIVE

## 2016-12-10 LAB — URINALYSIS, COMPLETE (UACMP) WITH MICROSCOPIC
BILIRUBIN URINE: NEGATIVE
GLUCOSE, UA: NEGATIVE mg/dL
Hgb urine dipstick: NEGATIVE
KETONES UR: NEGATIVE mg/dL
LEUKOCYTES UA: NEGATIVE
Nitrite: NEGATIVE
PH: 6 (ref 5.0–8.0)
Protein, ur: NEGATIVE mg/dL
Specific Gravity, Urine: 1.014 (ref 1.005–1.030)

## 2016-12-10 LAB — PROTIME-INR
INR: 0.94
PROTHROMBIN TIME: 12.6 s (ref 11.4–15.2)

## 2016-12-10 LAB — BASIC METABOLIC PANEL
Anion gap: 7 (ref 5–15)
BUN: 23 mg/dL — AB (ref 6–20)
CHLORIDE: 105 mmol/L (ref 101–111)
CO2: 29 mmol/L (ref 22–32)
CREATININE: 1.18 mg/dL — AB (ref 0.44–1.00)
Calcium: 9.5 mg/dL (ref 8.9–10.3)
GFR calc Af Amer: 48 mL/min — ABNORMAL LOW (ref >60)
GFR calc non Af Amer: 42 mL/min — ABNORMAL LOW (ref >60)
Glucose, Bld: 115 mg/dL — ABNORMAL HIGH (ref 65–99)
Potassium: 3.7 mmol/L (ref 3.5–5.1)
SODIUM: 141 mmol/L (ref 135–145)

## 2016-12-10 LAB — SEDIMENTATION RATE: SED RATE: 30 mm/h (ref 0–30)

## 2016-12-10 LAB — CBC
HCT: 41.9 % (ref 35.0–47.0)
Hemoglobin: 14 g/dL (ref 12.0–16.0)
MCH: 30.4 pg (ref 26.0–34.0)
MCHC: 33.5 g/dL (ref 32.0–36.0)
MCV: 90.8 fL (ref 80.0–100.0)
Platelets: 229 10*3/uL (ref 150–440)
RBC: 4.62 MIL/uL (ref 3.80–5.20)
RDW: 13 % (ref 11.5–14.5)
WBC: 5.8 10*3/uL (ref 3.6–11.0)

## 2016-12-10 LAB — APTT: APTT: 32 s (ref 24–36)

## 2016-12-10 LAB — SURGICAL PCR SCREEN
MRSA, PCR: NEGATIVE
STAPHYLOCOCCUS AUREUS: NEGATIVE

## 2016-12-10 NOTE — Patient Instructions (Signed)
Your procedure is scheduled [id number] December 16, 2016 Su procedimiento est programado para: Report to [place] a: To find out your arrival time please call [phone] between 1PM - 3PM on Monday December 15, 2016 Para saber su hora de llegada por favor llame al ([place]:  Remember: Instructions that are not followed completely may result in serious medical risk, up to and including death, or upon the discretion of your surgeon and anesthesiologist your surgery may need to be rescheduled.  Recuerde: Las instrucciones que no se siguen completamente Heritage manager en un riesgo de salud grave, incluyendo hasta la [place] o a discrecin de su cirujano y Environmental health practitioner, su ciruga se puede posponer.   __X__ 1. Do not eat food or drink liquids after midnight. No gum chewing or hard candies.  No coma alimentos ni tome lquidos despus de la medianoche.  No mastique chicle ni caramelos  duros.     __X__ 2. No alcohol for 24 hours before or after surgery.    No tome alcohol durante las 24 horas antes ni despus de la [place].   __X__ 3. Bring all medications with you on the day of surgery if instructed. BRING ANY NEW MEDICATIONS TO HOSPITAL    Lleve todos los medicamentos con usted el da de su ciruga si se le ha indicado as.   __X__ 4. Notify your doctor if there is any change in your medical condition (cold, fever,                             infections).    Informe a su mdico si hay algn cambio en su condicin mdica (resfriado, fiebre, infecciones).   Do not wear jewelry, make-up, hairpins, clips or nail polish.  No use joyas, maquillajes, pinzas/ganchos para el cabello ni esmalte de uas.  Do not wear lotions, powders, or perfumes. You may wear deodorant.  No use lociones, polvos o perfumes.  Puede usar desodorante.    Do not shave 48 hours prior to surgery. Men may shave face and neck.  No se afeite 48  horas antes de la [place].  Los hombres pueden [employer] cara y el cuello.   Do not bring valuables to the hospital.   No lleve objetos [facility] is not responsible for any belongings or valuables.  College no se hace responsable de ningn tipo de pertenencias u objetos de Geographical information systems officer.               Contacts, dentures or bridgework may not be worn into surgery.  Los lentes de [place], las dentaduras postizas o puentes no se pueden usar en la [place].  Leave your suitcase in the car. After surgery it may be brought to your room.  Deje su maleta en el auto.  Despus de la ciruga podr traerla a su habitacin.  For patients admitted to the hospital, discharge time is determined by your treatment team.  Para los pacientes que sean ingresados al hospital, el tiempo en el cual se le dar de alta es determinado por su                equipo de [place].   Patients discharged the day of surgery will not be allowed to drive home. A los pacientes que se les da de alta el mismo da de la ciruga no se les  permitir conducir a casa.   Please read over the following fact sheets that you were given: Por favor [place] informacin que le dieron:    MRSA INFO AND CHG INFO   ___X_ Take these medicines the morning of surgery with A SIP OF WATER:          [employer] medicinas la maana de la ciruga con UN SORBO DE AGUA:  1. NONE   2.   3.   4.       5.  6.  ____ Fleet Enema (as directed)          Enema de Fleet (segn lo indicado)    _X__ Use CHG Soap as directed          Utilice el jabn de CHG segn lo indicado  ____ Use inhalers on the day of surgery          Use los inhaladores el da de la ciruga  ____ Stop metformin 2 days prior to surgery          Deje de tomar el metformin 2 das antes de la ciruga    ____ Take 1/2 of usual insulin dose the night before surgery and none on the morning of surgery           Tome la mitad de la dosis  habitual de insulina la noche antes de la [place] y no tome nada en la maana de la             ciruga  ____ Stop Coumadin/Plavix/aspirin on          Deje de tomar el Coumadin/Plavix/aspirina el da:  ___X_ Stop Anti-inflammatories UNTIL AFTER SURGERY- IBUPROFEN, ALEVE, MOTRIN, ADVIL AND NAPROXEN   USE ONLY TYLENOL OR ACETAMINOPHEN          Deje de tomar antiinflamatorios el da:   __X_ Stop supplements until after surgery  GARLIC AND CENTRUM SILVER -- MAY TAKE PROBIOTIC          Deje de tomar suplementos hasta despus de la ciruga  ____ Bring C-Pap to the hospital          [place] al hospital

## 2016-12-11 LAB — URINE CULTURE

## 2016-12-12 NOTE — Pre-Procedure Instructions (Signed)
Urine culture sent to Dr. Rudene Christians for review.  Asked if wanted it recollected as suggested?

## 2016-12-16 ENCOUNTER — Inpatient Hospital Stay
Admission: RE | Admit: 2016-12-16 | Discharge: 2016-12-18 | DRG: 470 | Disposition: A | Discharge status: Skilled Nursing Facility | Payer: Medicare Other | Source: Ambulatory Visit | Attending: Orthopedic Surgery | Admitting: Orthopedic Surgery

## 2016-12-16 ENCOUNTER — Encounter
Admission: RE | Disposition: A | Discharge status: Skilled Nursing Facility | Payer: Self-pay | Source: Ambulatory Visit | Attending: Orthopedic Surgery

## 2016-12-16 ENCOUNTER — Inpatient Hospital Stay: Payer: Medicare Other

## 2016-12-16 ENCOUNTER — Inpatient Hospital Stay: Payer: Medicare Other | Admitting: Certified Registered"

## 2016-12-16 DIAGNOSIS — H9190 Unspecified hearing loss, unspecified ear: Secondary | ICD-10-CM | POA: Diagnosis present

## 2016-12-16 DIAGNOSIS — Z79899 Other long term (current) drug therapy: Secondary | ICD-10-CM

## 2016-12-16 DIAGNOSIS — K219 Gastro-esophageal reflux disease without esophagitis: Secondary | ICD-10-CM | POA: Diagnosis present

## 2016-12-16 DIAGNOSIS — E785 Hyperlipidemia, unspecified: Secondary | ICD-10-CM | POA: Diagnosis present

## 2016-12-16 DIAGNOSIS — M1612 Unilateral primary osteoarthritis, left hip: Principal | ICD-10-CM | POA: Diagnosis present

## 2016-12-16 DIAGNOSIS — D62 Acute posthemorrhagic anemia: Secondary | ICD-10-CM | POA: Diagnosis not present

## 2016-12-16 DIAGNOSIS — Z87891 Personal history of nicotine dependence: Secondary | ICD-10-CM

## 2016-12-16 DIAGNOSIS — Z419 Encounter for procedure for purposes other than remedying health state, unspecified: Secondary | ICD-10-CM

## 2016-12-16 DIAGNOSIS — G8918 Other acute postprocedural pain: Secondary | ICD-10-CM

## 2016-12-16 HISTORY — PX: TOTAL HIP ARTHROPLASTY: SHX124

## 2016-12-16 LAB — CBC
HCT: 37.2 % (ref 35.0–47.0)
Hemoglobin: 12.6 g/dL (ref 12.0–16.0)
MCH: 30.9 pg (ref 26.0–34.0)
MCHC: 33.9 g/dL (ref 32.0–36.0)
MCV: 91.3 fL (ref 80.0–100.0)
PLATELETS: 208 10*3/uL (ref 150–440)
RBC: 4.08 MIL/uL (ref 3.80–5.20)
RDW: 12.6 % (ref 11.5–14.5)
WBC: 15 10*3/uL — AB (ref 3.6–11.0)

## 2016-12-16 LAB — CREATININE, SERUM
CREATININE: 0.78 mg/dL (ref 0.44–1.00)
GFR calc non Af Amer: >60 mL/min (ref >60)

## 2016-12-16 LAB — ABO/RH: ABO/RH(D): AB NEG

## 2016-12-16 SURGERY — ARTHROPLASTY, HIP, TOTAL, ANTERIOR APPROACH
Anesthesia: Spinal | Site: Hip | Laterality: Left | Wound class: Clean

## 2016-12-16 MED ORDER — METOCLOPRAMIDE HCL 10 MG PO TABS: 5.0000 mg | ORAL_TABLET | Freq: Three times a day (TID) | ORAL | Status: DC | PRN

## 2016-12-16 MED ORDER — BUPIVACAINE HCL (PF) 0.5 % IJ SOLN
INTRAMUSCULAR | Status: AC
  Filled 2016-12-16: qty 10

## 2016-12-16 MED ORDER — TRANEXAMIC ACID 1000 MG/10ML IV SOLN
1000.0000 mg | INTRAVENOUS | Status: AC
  Administered 2016-12-16: 1000 mg via INTRAVENOUS
  Filled 2016-12-16: qty 10

## 2016-12-16 MED ORDER — GLYCOPYRROLATE 0.2 MG/ML IJ SOLN
INTRAMUSCULAR | Status: DC | PRN
Start: 2016-12-16 — End: 2016-12-16
  Administered 2016-12-16: 0.2 mg via INTRAVENOUS

## 2016-12-16 MED ORDER — PROMETHAZINE HCL 25 MG/ML IJ SOLN
INTRAMUSCULAR | Status: AC
  Administered 2016-12-16: 6.25 mg via INTRAVENOUS
  Filled 2016-12-16: qty 1

## 2016-12-16 MED ORDER — ALUM & MAG HYDROXIDE-SIMETH 200-200-20 MG/5ML PO SUSP
30.0000 mL | ORAL | Status: DC | PRN
Start: 2016-12-16 — End: 2016-12-18

## 2016-12-16 MED ORDER — BUPIVACAINE HCL (PF) 0.5 % IJ SOLN
INTRAMUSCULAR | Status: DC | PRN
  Administered 2016-12-16: 3 mL via INTRATHECAL

## 2016-12-16 MED ORDER — METHOCARBAMOL 1000 MG/10ML IJ SOLN
500.0000 mg | Freq: Four times a day (QID) | INTRAVENOUS | Status: DC | PRN
  Filled 2016-12-16: qty 5

## 2016-12-16 MED ORDER — FAMOTIDINE 20 MG PO TABS
ORAL_TABLET | ORAL | Status: AC
  Administered 2016-12-16: 20 mg via ORAL
  Filled 2016-12-16: qty 1

## 2016-12-16 MED ORDER — PHENOL 1.4 % MT LIQD
1.0000 | OROMUCOSAL | Status: DC | PRN
  Filled 2016-12-16: qty 177

## 2016-12-16 MED ORDER — SODIUM CHLORIDE 0.9 % IV SOLN
INTRAVENOUS | Status: DC
  Administered 2016-12-16 – 2016-12-17 (×2): via INTRAVENOUS

## 2016-12-16 MED ORDER — NEOMYCIN-POLYMYXIN B GU 40-200000 IR SOLN
Status: AC
  Filled 2016-12-16: qty 4

## 2016-12-16 MED ORDER — MAGNESIUM CITRATE PO SOLN
1.0000 | Freq: Once | ORAL | Status: DC | PRN
  Filled 2016-12-16: qty 296

## 2016-12-16 MED ORDER — ONDANSETRON HCL 4 MG/2ML IJ SOLN
4.0000 mg | Freq: Once | INTRAMUSCULAR | Status: AC | PRN
  Administered 2016-12-16: 4 mg via INTRAVENOUS

## 2016-12-16 MED ORDER — CEFAZOLIN SODIUM-DEXTROSE 2-4 GM/100ML-% IV SOLN
INTRAVENOUS | Status: AC
  Filled 2016-12-16: qty 100

## 2016-12-16 MED ORDER — ONDANSETRON HCL 4 MG/2ML IJ SOLN
INTRAMUSCULAR | Status: AC
  Filled 2016-12-16: qty 2

## 2016-12-16 MED ORDER — PROPOFOL 10 MG/ML IV BOLUS
INTRAVENOUS | Status: AC
  Filled 2016-12-16: qty 20

## 2016-12-16 MED ORDER — FENTANYL CITRATE (PF) 100 MCG/2ML IJ SOLN: 25.0000 ug | INTRAMUSCULAR | Status: DC | PRN

## 2016-12-16 MED ORDER — LACTATED RINGERS IV SOLN
INTRAVENOUS | Status: DC
  Administered 2016-12-16 (×2): via INTRAVENOUS

## 2016-12-16 MED ORDER — BISACODYL 10 MG RE SUPP
10.0000 mg | Freq: Every day | RECTAL | Status: DC | PRN
  Administered 2016-12-18: 10 mg via RECTAL
  Filled 2016-12-16: qty 1

## 2016-12-16 MED ORDER — LIDOCAINE HCL (PF) 2 % IJ SOLN
INTRAMUSCULAR | Status: DC | PRN
  Administered 2016-12-16: 50 mg

## 2016-12-16 MED ORDER — PROPOFOL 500 MG/50ML IV EMUL
INTRAVENOUS | Status: DC | PRN
  Administered 2016-12-16: 25 ug/kg/min via INTRAVENOUS

## 2016-12-16 MED ORDER — PROPOFOL 10 MG/ML IV BOLUS
INTRAVENOUS | Status: DC | PRN
  Administered 2016-12-16: 20 mg via INTRAVENOUS

## 2016-12-16 MED ORDER — DOCUSATE SODIUM 100 MG PO CAPS
100.0000 mg | ORAL_CAPSULE | Freq: Two times a day (BID) | ORAL | Status: DC
  Administered 2016-12-16 – 2016-12-18 (×4): 100 mg via ORAL
  Filled 2016-12-16 (×4): qty 1

## 2016-12-16 MED ORDER — ONDANSETRON HCL 4 MG/2ML IJ SOLN
4.0000 mg | Freq: Four times a day (QID) | INTRAMUSCULAR | Status: DC | PRN
  Administered 2016-12-16: 4 mg via INTRAVENOUS
  Filled 2016-12-16: qty 2

## 2016-12-16 MED ORDER — GLYCOPYRROLATE 0.2 MG/ML IJ SOLN
INTRAMUSCULAR | Status: AC
  Filled 2016-12-16: qty 1

## 2016-12-16 MED ORDER — PHENYLEPHRINE HCL 10 MG/ML IJ SOLN
INTRAMUSCULAR | Status: DC | PRN
  Administered 2016-12-16: 100 ug via INTRAVENOUS

## 2016-12-16 MED ORDER — MAGNESIUM HYDROXIDE 400 MG/5ML PO SUSP
30.0000 mL | Freq: Every day | ORAL | Status: DC | PRN
Start: 2016-12-16 — End: 2016-12-18
  Administered 2016-12-17: 30 mL via ORAL
  Filled 2016-12-16 (×2): qty 30

## 2016-12-16 MED ORDER — MORPHINE SULFATE (PF) 2 MG/ML IV SOLN
2.0000 mg | INTRAVENOUS | Status: DC | PRN
  Administered 2016-12-16: 2 mg via INTRAVENOUS
  Filled 2016-12-16: qty 1

## 2016-12-16 MED ORDER — ACETAMINOPHEN 500 MG PO TABS
1000.0000 mg | ORAL_TABLET | Freq: Four times a day (QID) | ORAL | Status: AC
  Administered 2016-12-16 – 2016-12-17 (×3): 1000 mg via ORAL
  Filled 2016-12-16 (×3): qty 2

## 2016-12-16 MED ORDER — ADULT MULTIVITAMIN W/MINERALS CH
1.0000 | ORAL_TABLET | Freq: Every day | ORAL | Status: DC
  Administered 2016-12-17 – 2016-12-18 (×2): 1 via ORAL
  Filled 2016-12-16 (×2): qty 1

## 2016-12-16 MED ORDER — METOCLOPRAMIDE HCL 5 MG/ML IJ SOLN: 5.0000 mg | Freq: Three times a day (TID) | INTRAMUSCULAR | Status: DC | PRN

## 2016-12-16 MED ORDER — NEOMYCIN-POLYMYXIN B GU 40-200000 IR SOLN
Status: DC | PRN
  Administered 2016-12-16: 4 mL

## 2016-12-16 MED ORDER — FAMOTIDINE 20 MG PO TABS
20.0000 mg | ORAL_TABLET | Freq: Once | ORAL | Status: AC
  Administered 2016-12-16: 20 mg via ORAL

## 2016-12-16 MED ORDER — BUPIVACAINE-EPINEPHRINE (PF) 0.25% -1:200000 IJ SOLN
INTRAMUSCULAR | Status: AC
  Filled 2016-12-16: qty 30

## 2016-12-16 MED ORDER — DIPHENHYDRAMINE HCL 12.5 MG/5ML PO ELIX: 12.5000 mg | ORAL_SOLUTION | ORAL | Status: DC | PRN

## 2016-12-16 MED ORDER — PROMETHAZINE HCL 25 MG/ML IJ SOLN
6.2500 mg | Freq: Four times a day (QID) | INTRAMUSCULAR | Status: DC | PRN
  Administered 2016-12-16: 6.25 mg via INTRAVENOUS

## 2016-12-16 MED ORDER — ENOXAPARIN SODIUM 40 MG/0.4ML ~~LOC~~ SOLN
40.0000 mg | SUBCUTANEOUS | Status: DC
  Administered 2016-12-17 – 2016-12-18 (×2): 40 mg via SUBCUTANEOUS
  Filled 2016-12-16 (×2): qty 0.4

## 2016-12-16 MED ORDER — BUPIVACAINE-EPINEPHRINE 0.25% -1:200000 IJ SOLN
INTRAMUSCULAR | Status: DC | PRN
  Administered 2016-12-16: 30 mL

## 2016-12-16 MED ORDER — ZOLPIDEM TARTRATE 5 MG PO TABS: 5.0000 mg | ORAL_TABLET | Freq: Every evening | ORAL | Status: DC | PRN

## 2016-12-16 MED ORDER — ACETAMINOPHEN 325 MG PO TABS: 650.0000 mg | ORAL_TABLET | Freq: Four times a day (QID) | ORAL | Status: DC | PRN

## 2016-12-16 MED ORDER — FENTANYL CITRATE (PF) 100 MCG/2ML IJ SOLN
INTRAMUSCULAR | Status: DC | PRN
  Administered 2016-12-16: 50 ug via INTRAVENOUS
  Administered 2016-12-16 (×2): 25 ug via INTRAVENOUS

## 2016-12-16 MED ORDER — SODIUM CHLORIDE 0.9 % IJ SOLN
INTRAMUSCULAR | Status: AC
  Filled 2016-12-16: qty 10

## 2016-12-16 MED ORDER — ACETAMINOPHEN 650 MG RE SUPP: 650.0000 mg | Freq: Four times a day (QID) | RECTAL | Status: DC | PRN

## 2016-12-16 MED ORDER — OXYCODONE HCL 5 MG PO TABS
5.0000 mg | ORAL_TABLET | ORAL | Status: DC | PRN
  Administered 2016-12-16: 10 mg via ORAL
  Administered 2016-12-17 – 2016-12-18 (×4): 5 mg via ORAL
  Filled 2016-12-16: qty 1
  Filled 2016-12-16: qty 2
  Filled 2016-12-16 (×4): qty 1

## 2016-12-16 MED ORDER — CEFAZOLIN SODIUM-DEXTROSE 2-4 GM/100ML-% IV SOLN
2.0000 g | Freq: Once | INTRAVENOUS | Status: AC
  Administered 2016-12-16: 2 g via INTRAVENOUS

## 2016-12-16 MED ORDER — CEFAZOLIN IN D5W 1 GM/50ML IV SOLN
1.0000 g | Freq: Four times a day (QID) | INTRAVENOUS | Status: AC
  Administered 2016-12-16 – 2016-12-17 (×3): 1 g via INTRAVENOUS
  Filled 2016-12-16 (×3): qty 50

## 2016-12-16 MED ORDER — ONDANSETRON HCL 4 MG PO TABS
4.0000 mg | ORAL_TABLET | Freq: Four times a day (QID) | ORAL | Status: DC | PRN
  Filled 2016-12-16: qty 1

## 2016-12-16 MED ORDER — METHOCARBAMOL 500 MG PO TABS: 500.0000 mg | ORAL_TABLET | Freq: Four times a day (QID) | ORAL | Status: DC | PRN

## 2016-12-16 MED ORDER — MENTHOL 3 MG MT LOZG
1.0000 | LOZENGE | OROMUCOSAL | Status: DC | PRN
  Filled 2016-12-16: qty 9

## 2016-12-16 MED ORDER — HYDRALAZINE HCL 20 MG/ML IJ SOLN
10.0000 mg | Freq: Once | INTRAMUSCULAR | Status: AC
  Administered 2016-12-16: 10 mg via INTRAVENOUS

## 2016-12-16 MED ORDER — HYDRALAZINE HCL 20 MG/ML IJ SOLN
INTRAMUSCULAR | Status: AC
  Administered 2016-12-16: 10 mg via INTRAVENOUS
  Filled 2016-12-16: qty 1

## 2016-12-16 MED ORDER — LIDOCAINE HCL (PF) 2 % IJ SOLN
INTRAMUSCULAR | Status: AC
  Filled 2016-12-16: qty 2

## 2016-12-16 MED ORDER — FENTANYL CITRATE (PF) 100 MCG/2ML IJ SOLN
INTRAMUSCULAR | Status: AC
  Filled 2016-12-16: qty 2

## 2016-12-16 SURGICAL SUPPLY — 45 items
BLADE SAW SAG 18.5X105 (BLADE) ×3 IMPLANT
BNDG COHESIVE 6X5 TAN STRL LF (GAUZE/BANDAGES/DRESSINGS) ×6 IMPLANT
CANISTER SUCT 1200ML W/VALVE (MISCELLANEOUS) ×3 IMPLANT
CAPT HIP TOTAL 3 ×2 IMPLANT
CATH FOL LEG HOLDER (MISCELLANEOUS) ×3 IMPLANT
CATH TRAY METER 16FR LF (MISCELLANEOUS) ×3 IMPLANT
CHLORAPREP W/TINT 26ML (MISCELLANEOUS) ×3 IMPLANT
DRAPE C-ARM XRAY 36X54 (DRAPES) ×3 IMPLANT
DRAPE INCISE IOBAN 66X60 STRL (DRAPES) IMPLANT
DRAPE POUCH INSTRU U-SHP 10X18 (DRAPES) ×3 IMPLANT
DRAPE SHEET LG 3/4 BI-LAMINATE (DRAPES) ×9 IMPLANT
DRAPE TABLE BACK 80X90 (DRAPES) ×3 IMPLANT
DRSG OPSITE POSTOP 4X8 (GAUZE/BANDAGES/DRESSINGS) ×6 IMPLANT
ELECT BLADE 6.5 EXT (BLADE) ×3 IMPLANT
ELECT REM PT RETURN 9FT ADLT (ELECTROSURGICAL) ×3
ELECTRODE REM PT RTRN 9FT ADLT (ELECTROSURGICAL) ×1 IMPLANT
GLOVE BIOGEL PI IND STRL 9 (GLOVE) ×1 IMPLANT
GLOVE BIOGEL PI INDICATOR 9 (GLOVE) ×2
GLOVE SURG SYN 9.0  PF PI (GLOVE) ×4
GLOVE SURG SYN 9.0 PF PI (GLOVE) ×2 IMPLANT
GOWN SRG 2XL LVL 4 RGLN SLV (GOWNS) ×1 IMPLANT
GOWN STRL NON-REIN 2XL LVL4 (GOWNS) ×3
GOWN STRL REUS W/ TWL LRG LVL3 (GOWN DISPOSABLE) ×1 IMPLANT
GOWN STRL REUS W/TWL LRG LVL3 (GOWN DISPOSABLE) ×3
HEMOVAC 400CC 10FR (MISCELLANEOUS) IMPLANT
HOOD PEEL AWAY FLYTE STAYCOOL (MISCELLANEOUS) ×3 IMPLANT
MAT BLUE FLOOR 46X72 FLO (MISCELLANEOUS) ×3 IMPLANT
NDL SAFETY 18GX1.5 (NEEDLE) ×3 IMPLANT
NDL SPNL 18GX3.5 QUINCKE PK (NEEDLE) ×1 IMPLANT
NEEDLE SPNL 18GX3.5 QUINCKE PK (NEEDLE) ×3 IMPLANT
NS IRRIG 1000ML POUR BTL (IV SOLUTION) ×3 IMPLANT
PACK HIP COMPR (MISCELLANEOUS) ×3 IMPLANT
SOL PREP PVP 2OZ (MISCELLANEOUS) ×3
SOLUTION PREP PVP 2OZ (MISCELLANEOUS) ×1 IMPLANT
STAPLER SKIN PROX 35W (STAPLE) ×3 IMPLANT
STRAP SAFETY BODY (MISCELLANEOUS) ×3 IMPLANT
SUT DVC 2 QUILL PDO  T11 36X36 (SUTURE) ×2
SUT DVC 2 QUILL PDO T11 36X36 (SUTURE) ×1 IMPLANT
SUT SILK 0 (SUTURE) ×3
SUT SILK 0 30XBRD TIE 6 (SUTURE) ×1 IMPLANT
SUT V-LOC 90 ABS DVC 3-0 CL (SUTURE) ×3 IMPLANT
SUT VIC AB 1 CT1 36 (SUTURE) ×3 IMPLANT
SYR 20CC LL (SYRINGE) ×3 IMPLANT
SYR 30ML LL (SYRINGE) ×3 IMPLANT
TOWEL OR 17X26 4PK STRL BLUE (TOWEL DISPOSABLE) ×3 IMPLANT

## 2016-12-16 NOTE — Anesthesia Procedure Notes (Signed)
Spinal  Patient location during procedure: OR Staffing Anesthesiologist: KEPHART, WILLIAM K Resident/CRNA: Chucky Homes Performed: resident/CRNA  Preanesthetic Checklist Completed: patient identified, site marked, surgical consent, pre-op evaluation, timeout performed, IV checked, risks and benefits discussed and monitors and equipment checked Spinal Block Patient position: sitting Prep: ChloraPrep and site prepped and draped Patient monitoring: heart rate, continuous pulse ox, blood pressure and cardiac monitor Approach: midline Location: L4-5 Injection technique: single-shot Needle Needle type: [place] and Introducer  Needle gauge: 25 G Needle length: 9 cm Additional Notes Negative paresthesia. Negative blood return. Positive free-flowing CSF. Expiration date of kit checked and confirmed. Patient tolerated procedure well, without complications.       

## 2016-12-16 NOTE — Transfer of Care (Signed)
Immediate Anesthesia Transfer of Care Note  Patient: Linda Juarez  Procedure(s) Performed: Procedure(s): TOTAL HIP ARTHROPLASTY ANTERIOR APPROACH (Left)  Patient Location: PACU  Anesthesia Type:Spinal  Level of Consciousness: awake and alert   Airway & Oxygen Therapy: Patient Spontanous Breathing  Post-op Assessment: Report given to RN and Post -op Vital signs reviewed and stable  Post vital signs: Reviewed  Last Vitals:  Vitals:   12/16/16 1144 12/16/16 1520  BP: (!) 194/95 120/66  Pulse: 71 78  Resp:  15  Temp: 36.4 C 36.2 C    Last Pain:  Vitals:   12/16/16 1144  TempSrc: Oral         Complications: No apparent anesthesia complications

## 2016-12-16 NOTE — Op Note (Signed)
12/16/2016  3:36 PM  PATIENT:  Linda Juarez  83 y.o. female  PRE-OPERATIVE DIAGNOSIS:  PRIMARY OSTEOARTHRITIS LEFT HIP  POST-OPERATIVE DIAGNOSIS:  PRIMARY OSTEOARTHRITIS LEFT HIP  PROCEDURE:  Procedure(s): TOTAL HIP ARTHROPLASTY ANTERIOR APPROACH (Left)  SURGEON: Laurene Footman, MD  ASSISTANTS: None  ANESTHESIA:   spinal  EBL:  Total I/O In: 1200 [I.V.:1200] Out: 700 [Urine:400; Blood:300]  BLOOD ADMINISTERED:none  DRAINS: (2) Hemovact drain(s) in the Subcutaneous layer with  Suction Open   LOCAL MEDICATIONS USED:  MARCAINE     SPECIMEN:  Source of Specimen:  Left femoral head  DISPOSITION OF SPECIMEN:  PATHOLOGY  COUNTS:  YES  TOURNIQUET:  * No tourniquets in log *  IMPLANTS: Medacta AMIS 2 femur standard stem, 54 mm Mpact DM with liner and S 28 mm head  DICTATION: .Dragon Dictation   The patient was brought to the operating room and after spinal anesthesia was obtained patient was placed on the operative table with the ipsilateral foot into the Medacta attachment, contralateral leg on a well-padded table. C-arm was brought in and preop template x-ray taken. After prepping and draping in usual sterile fashion appropriate patient identification and timeout procedures were completed. Anterior approach to the hip was obtained and centered over the greater trochanter and TFL muscle. The subcutaneous tissue was incised hemostasis being achieved by electrocautery. TFL fascia was incised and the muscle retracted laterally deep retractor placed. The lateral femoral circumflex vessels were identified and ligated. The anterior capsule was exposed and a capsulotomy performed. The neck was identified and a femoral neck cut carried out with a saw. The head was removed without difficulty and showed sclerotic femoral head and acetabulum. Reaming was carried out to 52 mm and a 54 mm cup trial gave appropriate tightness to the acetabular component a 54 DM cup was impacted into position. The  leg was then externally rotated and ischiofemoral and pubofemoral releases carried out. The femur was sequentially broached to a size 2, size 2 standard stem with S head trials were placed and the final components chosen. The 2 standard stem was inserted along with a S 28 mm head and 54 mm liner. The hip was reduced and was stable the wound was thoroughly irrigated. The deep fascia was closed using a heavy Quill after infiltration of 30 cc of quarter percent Sensorcaine with epinephrine. Subcutaneous drains were then inserted. 3-0 v-loc to close the skin with skin staples, honeycomb dressing applied to incisions with drain sponge and drain site  PLAN OF CARE: Admit to inpatient

## 2016-12-16 NOTE — Anesthesia Post-op Follow-up Note (Cosign Needed)
Anesthesia QCDR form completed.        

## 2016-12-16 NOTE — Progress Notes (Signed)
Dr. Kayleen Memos called due to pt BP being 187/89. He stated to wait to see what pt next BP would be.  Pt then had an episode of vomiting. BP retaken and it was 207/80. New orders received. Saber Dickerman E 5:21 PM 12/16/2016

## 2016-12-16 NOTE — NC FL2 (Signed)
  [place] LEVEL OF CARE SCREENING TOOL     IDENTIFICATION  Patient Name: Linda Juarez Birthdate: [birth year] Sex: female Admission Date (Current Location): 12/16/2016  [place] and [place] Number:  Engineering geologist and Address:  [facility], [address] [place], [place] [address]      Provider Number: [id number]  Attending Physician Name and Address:  Hessie Knows, MD  Relative Name and Phone Number:       Current Level of Care: Hospital Recommended Level of Care: [place] Prior Approval Number:    Date Approved/Denied:   PASRR Number:  [id number])  Discharge Plan: SNF    Current Diagnoses: Patient Active Problem List   Diagnosis Date Noted  . Healthcare maintenance 09/29/2016  . Diarrhea 07/04/2016  . Hip pain, bilateral 10/09/2015  . Medicare annual wellness visit, subsequent 01/28/2015  . Advance care planning 01/28/2015  . HLD (hyperlipidemia) 01/16/2015  . Diverticulitis of colon without hemorrhage 06/07/2014  . Colon cancer screening 12/15/2012  . Skin lesion 12/15/2012    Orientation RESPIRATION BLADDER Height & Weight     Self, Time, Situation, Place  Normal Continent Weight: 167 lb (75.8 kg) Height:  5\' 6"  (167.6 cm)  BEHAVIORAL SYMPTOMS/MOOD NEUROLOGICAL BOWEL NUTRITION STATUS   (none)  (none) Continent Diet (Regular Diet )  AMBULATORY STATUS COMMUNICATION OF NEEDS Skin   Extensive Assist Verbally Surgical wounds (Incision: Left Hip. )                       Personal Care Assistance Level of Assistance  Bathing, Feeding, Dressing Bathing Assistance: Limited assistance Feeding assistance: Independent Dressing Assistance: Limited assistance     Functional Limitations Info  Sight, Hearing, Speech Sight Info: Adequate Hearing Info: Impaired Speech Info: Adequate    SPECIAL CARE FACTORS FREQUENCY  PT (By licensed PT), OT (By licensed OT)     PT Frequency:  (5) OT  Frequency:  (5)            Contractures      Additional Factors Info  Code Status, Allergies Code Status Info:  (Not on file ) Allergies Info:  (No Known Allergies. )           Current Medications (12/16/2016):  This is the current hospital active medication list Current Facility-Administered Medications  Medication Dose Route Frequency Provider Last Rate Last Dose  . fentaNYL (SUBLIMAZE) injection 25 mcg  25 mcg Intravenous Q5 min PRN Gunnar Fusi, MD      . lactated ringers infusion   Intravenous Continuous Martha Clan, MD      . ondansetron [facility]) injection 4 mg  4 mg Intravenous Once PRN Gunnar Fusi, MD         Discharge Medications: Please see discharge summary for a list of discharge medications.  Relevant Imaging Results:  Relevant Lab Results:   Additional Information  (SSN: [id number])  Jonathyn Carothers, Veronia Beets, LCSW

## 2016-12-16 NOTE — H&P (Signed)
Reviewed paper H+P, will be scanned into chart. Patient examined No changes noted.  

## 2016-12-16 NOTE — Anesthesia Preprocedure Evaluation (Signed)
Anesthesia Evaluation  Patient identified by MRN, date of birth, ID band Patient awake    Reviewed: Allergy & Precautions, NPO status , Patient's Chart, lab work & pertinent test results  History of Anesthesia Complications Negative for: history of anesthetic complications  Airway Mallampati: II       Dental   Pulmonary neg pulmonary ROS, former smoker,           Cardiovascular negative cardio ROS       Neuro/Psych negative neurological ROS     GI/Hepatic Neg liver ROS, GERD  ,  Endo/Other  negative endocrine ROS  Renal/GU negative Renal ROS     Musculoskeletal   Abdominal   Peds  Hematology   Anesthesia Other Findings   Reproductive/Obstetrics                             Anesthesia Physical Anesthesia Plan  ASA: II  Anesthesia Plan: Spinal   Post-op Pain Management:    Induction:   Airway Management Planned:   Additional Equipment:   Intra-op Plan:   Post-operative Plan:   Informed Consent: I have reviewed the patients History and Physical, chart, labs and discussed the procedure including the risks, benefits and alternatives for the proposed anesthesia with the patient or authorized representative who has indicated his/her understanding and acceptance.     Plan Discussed with:   Anesthesia Plan Comments:         Anesthesia Quick Evaluation

## 2016-12-17 ENCOUNTER — Encounter: Payer: Self-pay | Admitting: Orthopedic Surgery

## 2016-12-17 LAB — CBC
HEMATOCRIT: 32.7 % — AB (ref 35.0–47.0)
Hemoglobin: 11.4 g/dL — ABNORMAL LOW (ref 12.0–16.0)
MCH: 31.8 pg (ref 26.0–34.0)
MCHC: 34.9 g/dL (ref 32.0–36.0)
MCV: 91.3 fL (ref 80.0–100.0)
PLATELETS: 190 10*3/uL (ref 150–440)
RBC: 3.58 MIL/uL — ABNORMAL LOW (ref 3.80–5.20)
RDW: 12.6 % (ref 11.5–14.5)
WBC: 9.5 10*3/uL (ref 3.6–11.0)

## 2016-12-17 LAB — BASIC METABOLIC PANEL
Anion gap: 5 (ref 5–15)
BUN: 16 mg/dL (ref 6–20)
CALCIUM: 8.3 mg/dL — AB (ref 8.9–10.3)
CO2: 26 mmol/L (ref 22–32)
Chloride: 106 mmol/L (ref 101–111)
Creatinine, Ser: 0.74 mg/dL (ref 0.44–1.00)
GFR calc Af Amer: >60 mL/min (ref >60)
GLUCOSE: 127 mg/dL — AB (ref 65–99)
Potassium: 3.9 mmol/L (ref 3.5–5.1)
Sodium: 137 mmol/L (ref 135–145)

## 2016-12-17 MED ORDER — ENOXAPARIN SODIUM 40 MG/0.4ML ~~LOC~~ SOLN: 40.0000 mg | SUBCUTANEOUS | 0 refills | Status: DC

## 2016-12-17 MED ORDER — OXYCODONE HCL 5 MG PO TABS: 5.0000 mg | ORAL_TABLET | ORAL | 0 refills | Status: DC | PRN

## 2016-12-17 NOTE — Evaluation (Signed)
Occupational Therapy Evaluation Patient Details Name: Linda Juarez MRN: [id number] DOB: 24-Mar-1932 Today's Date: 12/17/2016    History of Present Illness Pt is a 83 y/o F s/p L THA direct anterior approach, with PMhx significant for OA, GERD, diverticulitis, and HLD. POD#1 for OT evaluation.   Clinical Impression   Pt seen for OT evaluation this date. Pt was independent prior to L THA at [place] apartment, living alone. Pt presents with pain in L hip, decreased strength/ROM in hip, decreased activity tolerance and knowledge of AE for ADL, and increased need for assist with functional mobility and self care tasks and increased falls risk. Pt would benefit from skilled OT services to address noted impairments and functional deficits including education/training in AE for LB ADL, energy conservation strategies, and home/routines modifications in order to maximize return to PLOF and minimize falls risk.    Follow Up Recommendations  SNF    Equipment Recommendations  Other (comment) (handheld shower head, reacher, sock aid)    Recommendations for Other Services       Precautions / Restrictions Precautions Precautions: Fall;Other (comment) Precaution Comments: monitor BP Restrictions Weight Bearing Restrictions: Yes LLE Weight Bearing: Weight bearing as tolerated      Mobility Bed Mobility               General bed mobility comments: did not assess, pt up in recliner at start of session  Transfers                      Balance Overall balance assessment: Needs assistance;History of Falls Sitting-balance support: No upper extremity supported;Feet supported Sitting balance-Leahy Scale: Good                                     ADL either performed or assessed with clinical judgement   ADL Overall ADL's : Needs assistance/impaired Eating/Feeding: Sitting;Set up   Grooming: Sitting;Set up   Upper Body Bathing: Set up;Supervision/ safety;Sitting    Lower Body Bathing: Sitting/lateral leans;Set up;Minimal assistance   Upper Body Dressing : Sitting;Set up   Lower Body Dressing: Sitting/lateral leans;Minimal assistance;Cueing for compensatory techniques;With adaptive equipment Lower Body Dressing Details (indicate cue type and reason): pt educated in use of AE for LB dressing, requiring min assist to perform using AE                General ADL Comments: Pt generally Min A for LB ADL     Vision Baseline Vision/History: Wears glasses Wears Glasses: Reading only Patient Visual Report: No change from baseline Vision Assessment?: No apparent visual deficits     Perception     Praxis Praxis Praxis tested?: Within functional limits    Pertinent Vitals/Pain Pain Assessment: 0-10 Pain Score: 2  Pain Location: L hip Pain Descriptors / Indicators: Aching;Operative site guarding Pain Intervention(s): Limited activity within patient's tolerance;Monitored during session;Premedicated before session;Repositioned     Hand Dominance     Extremity/Trunk Assessment Upper Extremity Assessment Upper Extremity Assessment: Overall WFL for tasks assessed   Lower Extremity Assessment Lower Extremity Assessment: Defer to PT evaluation;LLE deficits/detail   Cervical / Trunk Assessment Cervical / Trunk Assessment: Kyphotic   Communication Communication Communication: HOH   Cognition Arousal/Alertness: Awake/alert Behavior During Therapy: WFL for tasks assessed/performed Overall Cognitive Status: Within Functional Limits for tasks assessed  General Comments       Exercises Other Exercises Other Exercises: Pt/nephew educated in home modifications to support safety and functional independence    Shoulder Instructions      Home Living Family/patient expects to be discharged to:: Private residence Living Arrangements: Alone Available Help at Discharge: Family;Available 24  hours/day (niece will stay with pt 24/7 for 1 week after 12/23/16 ) Type of Home: Independent living facility (ILF apartment) Home Access: Level entry     Home Layout: One level     Bathroom Shower/Tub: Occupational psychologist: Handicapped height (with toilet riser) Bathroom Accessibility: Yes How Accessible: Accessible via walker Home Equipment: [place] - 4 wheels;Toilet riser;Grab bars - tub/shower;Bedside commode;Shower seat (rollator)   Additional Comments: Pt's nephew has BSC, shower chair he can provide pt with      Prior Functioning/Environment Level of Independence: Independent        Comments: Pt was ambulating without AD.  She reports one fall in the past year when she was not looking where she was going and tripped.  She was independent with med mgt, managing hearing aids, bathing, dressing, cooking (mostly heating up frozen items).  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  Pt still drives.  She reports going [facility].        OT Problem List: Decreased strength;Pain;Decreased range of motion;Decreased safety awareness;Decreased activity tolerance;Decreased knowledge of use of DME or AE      OT Treatment/Interventions: Self-care/ADL training;Energy conservation;DME and/or AE instruction;Patient/family education    OT Goals(Current goals can be found in the care plan section) Acute Rehab OT Goals Patient Stated Goal: get better OT Goal Formulation: With patient/family Time For Goal Achievement: 12/31/16 Potential to Achieve Goals: Good  OT Frequency: Min 1X/week   Barriers to D/C: Decreased caregiver support  no support available until after 12/23/16, niece will be able to stay with pt for approx 1 week once home       Co-evaluation              End of Session    Activity Tolerance: Patient tolerated treatment well Patient left: in chair;with call bell/phone within reach;with chair alarm set;with family/visitor present  OT Visit  Diagnosis: Other abnormalities of gait and mobility (R26.89);Pain;History of falling (Z91.81);Muscle weakness (generalized) (M62.81) Pain - Right/Left: Left Pain - part of body: Hip                Time: 1443-1540 OT Time Calculation (min): 32 min Charges:  OT General Charges $OT Visit: 1 Procedure OT Evaluation $OT Eval Low Complexity: 1 Procedure OT Treatments $Self Care/Home Management : 23-37 mins G-Codes:     Jeni Salles, MPH, MS, OTR/L ascom [phone] 12/17/16, 1:56 PM

## 2016-12-17 NOTE — Discharge Instructions (Signed)

## 2016-12-17 NOTE — Care Management Note (Signed)
Case Management Note  Patient Details  Name: Linda Juarez MRN: [id number] Date of Birth: May 19, 1932  Subjective/Objective:  PT recommending SNF. CM consult. CSW following. Please re consult if needs arise.                   Action/Plan:   Expected Discharge Date:                  Expected Discharge Plan:  Skilled Nursing Facility  In-House Referral:  Clinical Social Work  Discharge planning Services  CM Consult  Post Acute Care Choice:    Choice offered to:     DME Arranged:    DME Agency:     HH Arranged:    [place] Agency:     Status of Service:  In process, will continue to follow  If discussed at Long Length of Stay Meetings, dates discussed:    Additional Comments:  Jolly Mango, RN 12/17/2016, 11:39 AM

## 2016-12-17 NOTE — Progress Notes (Signed)
qPhysical Therapy Treatment Patient Details Name: Linda Juarez MRN: [id number] DOB: [birth year] Today's Date: 12/17/2016    History of Present Illness Pt is a 83 y/o F s/p L THA direct anterior approach.     PT Comments    Linda Juarez is making modest progress with mobility.  She requires min guard assist for transfers.  Cues provided for upright posture and forward gaze while pt ambulates 100 ft using RW with a decreased gait speed. Pt denies dizzines/lightheadedness throughout session and BP taken at start of session in sitting: 141/77. SNF remains most appropriate d/c plan at this time.  Pt will benefit from continued skilled PT services to increase functional independence and safety.    Follow Up Recommendations  SNF     Equipment Recommendations  Rolling walker with 5" wheels    Recommendations for Other Services       Precautions / Restrictions Precautions Precautions: Fall;Other (comment) Precaution Comments: montior BP Restrictions Weight Bearing Restrictions: Yes LLE Weight Bearing: Weight bearing as tolerated    Mobility  Bed Mobility               General bed mobility comments: Pt sitting in chair upon PT arrival  Transfers Overall transfer level: Needs assistance Equipment used: Rolling walker (2 wheeled) Transfers: Sit to/from Stand Sit to Stand: Min guard         General transfer comment: Close min guard assist as pt requires mod verbal cues for hand placement and safe technique using RW.  She is slow to stand with mild unsteadiness noted.  Ambulation/Gait Ambulation/Gait assistance: Min guard Ambulation Distance (Feet): 100 Feet Assistive device: Rolling walker (2 wheeled) Gait Pattern/deviations: Step-to pattern;Decreased stride length;Decreased stance time - left;Decreased step length - right;Decreased weight shift to left;Antalgic;Trunk flexed Gait velocity: decreased Gait velocity interpretation: Below normal speed for age/gender General  Gait Details: Cues for upright posture and forward gaze.  Pt ambualtes with decreased gait speed and fatigues after ambulating ~90 ft.  Close min guard assist for safety.  Pt denied dizziness/lightheadedness throughout session.   Stairs            Wheelchair Mobility    Modified Rankin (Stroke Patients Only)       Balance Overall balance assessment: Needs assistance;History of Falls Sitting-balance support: No upper extremity supported;Feet supported Sitting balance-Leahy Scale: Good     Standing balance support: Bilateral upper extremity supported;During functional activity Standing balance-Leahy Scale: Poor Standing balance comment: Relies on BUE support for static and dynamic activities                            Cognition Arousal/Alertness: Awake/alert Behavior During Therapy: WFL for tasks assessed/performed Overall Cognitive Status: Within Functional Limits for tasks assessed                                        Exercises Total Joint Exercises Ankle Circles/Pumps: AROM;Both;10 reps;Seated Quad Sets: Strengthening;Both;10 reps;Seated Hip ABduction/ADduction: AAROM;Left;10 reps;Seated Straight Leg Raises: AAROM;Left;10 reps;Seated Long Arc Quad: Strengthening;Left;10 reps;Seated Other Exercises Other Exercises: BP taken at start of session 141/77 in sitting.  Pt denied dizziness/lightheadedness throughout session.    General Comments        Pertinent Vitals/Pain Pain Assessment: Faces Pain Score: 2  Faces Pain Scale: Hurts even more Pain Location: L hip Pain Descriptors / Indicators: Aching;Guarding Pain Intervention(s): Limited  activity within patient's tolerance;Monitored during session    [place] expects to be discharged to:: Private residence Living Arrangements: Alone Available Help at Discharge: Family;Available 24 hours/day (niece will stay with pt 24/7 for 1 week after 12/23/16 ) Type of Home:  Independent living facility (ILF apartment) Home Access: Level entry   Home Layout: One level Home Equipment: [place] - 4 wheels;Toilet riser;Grab bars - tub/shower;Bedside commode;Shower seat (rollator) Additional Comments: Pt's nephew has BSC, shower chair he can provide pt with    Prior Function Level of Independence: Independent      Comments: Pt was ambulating without AD.  She reports one fall in the past year when she was not looking where she was going and tripped.  She was independent with med mgt, managing hearing aids, bathing, dressing, cooking (mostly heating up frozen items).  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  Pt still drives.  She reports [facility].   PT Goals (current goals can now be found in the care plan section) Acute Rehab PT Goals Patient Stated Goal: to feel better and to get stronger PT Goal Formulation: With patient Time For Goal Achievement: 12/31/16 Potential to Achieve Goals: Good Progress towards PT goals: Progressing toward goals    Frequency    BID      PT Plan Current plan remains appropriate    Co-evaluation             End of Session Equipment Utilized During Treatment: Gait belt Activity Tolerance: Patient tolerated treatment well Patient left: in chair;with call bell/phone within reach;with chair alarm set Nurse Communication: Mobility status;Other (comment) (BP) PT Visit Diagnosis: Pain;History of falling (Z91.81);Muscle weakness (generalized) (M62.81) Pain - Right/Left: Left Pain - part of body: Hip     Time: 5409-8119 PT Time Calculation (min) (ACUTE ONLY): 25 min  Charges:  $Gait Training: 8-22 mins $Therapeutic Exercise: 8-22 mins                    G Codes:       Linda Juarez PT, DPT 12/17/2016, 3:36 PM

## 2016-12-17 NOTE — Evaluation (Addendum)
Physical Therapy Evaluation Patient Details Name: Linda Juarez MRN: [id number] DOB: 06/11/1932 Today's Date: 12/17/2016   History of Present Illness  Pt is a 83 y/o F s/p L THA direct anterior approach.   Clinical Impression  Pt is s/p THA resulting in the deficits listed below (see PT Problem List). Mrs. [place] was Ind PTA with bathing, dressing, cooking, driving.  PTA she ambulated without AD and reports one fall in the past year.  She current requires min assist to advance LLE out of bed for supine>sit. She requires close min guard assist for sit<>stand transfers with mod cues for safe technique using RW and proper hand placement.  She ambulated 80 ft before reporting dizziness/nausea with BP taken in sitting at 90/52.  Pt placed in reclined position and RN notified.  Pt reports this improving at end of session. Pt initially under the impression that her niece would be staying with her immediately following d/c but her niece will not be able to help pt until next Tuesday.  Given the fact that the pt lives alone, and given her current mobility status, recommending SNF at d/c. Pt will benefit from skilled PT to increase their independence and safety with mobility to allow discharge to the venue listed below.     Follow Up Recommendations SNF    Equipment Recommendations  Rolling walker with 5" wheels    Recommendations for Other Services OT consult     Precautions / Restrictions Precautions Precautions: Fall;Other (comment) Precaution Comments: montior BP Restrictions Weight Bearing Restrictions: Yes LLE Weight Bearing: Weight bearing as tolerated      Mobility  Bed Mobility Overal bed mobility: Needs Assistance Bed Mobility: Supine to Sit     Supine to sit: Min assist;HOB elevated     General bed mobility comments: Cues for sequencing.  Pt unable to perform leg hook technique and requires min assist to manage LLE.  Use of bed rail to pull up to sitting with increased time  and effort.  Transfers Overall transfer level: Needs assistance Equipment used: Rolling walker (2 wheeled) Transfers: Sit to/from Stand Sit to Stand: Min guard         General transfer comment: Close min guard assist as pt requires mod verbal cues for hand placement and safe technique using RW.  Pt control descent to chair well.  She is slow to stand with mild unsteadiness noted.  Ambulation/Gait Ambulation/Gait assistance: Min guard Ambulation Distance (Feet): 80 Feet Assistive device: Rolling walker (2 wheeled) Gait Pattern/deviations: Step-to pattern;Decreased stride length;Decreased stance time - left;Decreased step length - right;Decreased weight shift to left;Antalgic;Trunk flexed Gait velocity: decreased   General Gait Details: Cues for sequencing using RW and cues for upright posture and forward gaze.  Pt ambulates with step to pattern with decreased gait speed.  When approaching chair at end of ambulation pt reports "not feeling right" and reports dizziness/nausea.  Stairs            Wheelchair Mobility    Modified Rankin (Stroke Patients Only)       Balance Overall balance assessment: Needs assistance;History of Falls Sitting-balance support: No upper extremity supported;Feet supported Sitting balance-Leahy Scale: Good     Standing balance support: Bilateral upper extremity supported;During functional activity Standing balance-Leahy Scale: Poor Standing balance comment: Relies on BUE support for static and dynamic activities                             Pertinent  Vitals/Pain Pain Assessment: 0-10 Pain Score: 4  Pain Location: L hip (with some numbness surrounding incision) Pain Descriptors / Indicators: Aching;Guarding;Numbness Pain Intervention(s): Limited activity within patient's tolerance;Monitored during session;Repositioned    Home Living Family/patient expects to be discharged to:: Private residence Living Arrangements:  Alone Available Help at Discharge: Family;Available 24 hours/day (niece will be staying with pt 24/7  for 1 wk following d/c) Type of Home: Apartment Home Access: Level entry     Home Layout: One level Home Equipment: [place] - 4 wheels;Toilet riser;Grab bars - tub/shower (Rollator)      Prior Function Level of Independence: Independent         Comments: Pt was ambulating without AD.  She reports one fall in the past year when she was not looking where she was going and tripped.  She was independent with bathing, dressing, cooking (mostly heating up frozen items).  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  Pt still drives.  She reports [facility].     Hand Dominance        Extremity/Trunk Assessment   Upper Extremity Assessment Upper Extremity Assessment: Defer to OT evaluation    Lower Extremity Assessment Lower Extremity Assessment: LLE deficits/detail (RLE strength grossly 4+/5) LLE Deficits / Details: L hip flexion 3-/5, otherwise strength grossly 3/5 LLE Sensation:  (numbness surrounding incision)    Cervical / Trunk Assessment Cervical / Trunk Assessment: Kyphotic  Communication   Communication: HOH  Cognition Arousal/Alertness: Awake/alert Behavior During Therapy: WFL for tasks assessed/performed Overall Cognitive Status: Within Functional Limits for tasks assessed                                        General Comments General comments (skin integrity, edema, etc.): Pt reports "not feeling right" after ambulating in room and hallway and reports dizziness/nausea as she prepares to sit in chair.  Pt's chair reclined and BP reading 90/52.  Pt reports feeling better after a few minutes.  RN notified.    Exercises Total Joint Exercises Ankle Circles/Pumps: AROM;Both;10 reps;Supine Hip ABduction/ADduction: AAROM;Left;10 reps;Supine Straight Leg Raises: AAROM;Left;10 reps;Supine   Assessment/Plan    PT Assessment Patient needs  continued PT services  PT Problem List Decreased strength;Decreased range of motion;Decreased activity tolerance;Decreased balance;Decreased mobility;Decreased knowledge of use of DME;Decreased safety awareness;Pain       PT Treatment Interventions DME instruction;Gait training;Functional mobility training;Therapeutic activities;Therapeutic exercise;Balance training;Neuromuscular re-education;Patient/family education;Modalities    PT Goals (Current goals can be found in the Care Plan section)  Acute Rehab PT Goals Patient Stated Goal: to feel better and to get stronger PT Goal Formulation: With patient Time For Goal Achievement: 12/31/16 Potential to Achieve Goals: Good    Frequency BID   Barriers to discharge        Co-evaluation               End of Session Equipment Utilized During Treatment: Gait belt Activity Tolerance: Patient tolerated treatment well Patient left: in chair;with call bell/phone within reach;with chair alarm set;Other (comment) (with chair reclined) Nurse Communication: Mobility status;Other (comment) (BP) PT Visit Diagnosis: Pain;History of falling (Z91.81);Muscle weakness (generalized) (M62.81) Pain - Right/Left: Left Pain - part of body: Hip    Time: 1937-9024 PT Time Calculation (min) (ACUTE ONLY): 40 min   Charges:   PT Evaluation $PT Eval Low Complexity: 1 Procedure PT Treatments $Gait Training: 8-22 mins $Therapeutic Exercise: 8-22  mins   PT G Codes:        Collie Siad PT, DPT 12/17/2016, 11:40 AM

## 2016-12-17 NOTE — Anesthesia Postprocedure Evaluation (Signed)
Anesthesia Post Note  Patient: Linda Juarez  Procedure(s) Performed: Procedure(s) (LRB): TOTAL HIP ARTHROPLASTY ANTERIOR APPROACH (Left)  Patient location during evaluation: Nursing Unit Anesthesia Type: Spinal Level of consciousness: awake, awake and alert and oriented Pain management: pain level controlled Vital Signs Assessment: post-procedure vital signs reviewed and stable Respiratory status: spontaneous breathing, nonlabored ventilation and respiratory function stable Cardiovascular status: stable Postop Assessment: no headache and no backache Anesthetic complications: no     Last Vitals:  Vitals:   12/17/16 0356 12/17/16 0732  BP: (!) 118/59 (!) 126/58  Pulse: 70 71  Resp: 19 18  Temp: 36.6 C 36.8 C    Last Pain:  Vitals:   12/17/16 0732  TempSrc: Oral  PainSc:                  Ricki Miller

## 2016-12-17 NOTE — Discharge Summary (Addendum)
Physician Discharge Summary  Patient ID: Linda Juarez MRN: [id number] DOB/AGE: 81/18/1933 81 y.o.  Admit date: 12/16/2016 Discharge date: 12/17/2016  Admission Diagnoses:  Primary localized osteoarthritis of left hip [M16.12]   Discharge Diagnoses: Patient Active Problem List   Diagnosis Date Noted  . Primary localized osteoarthritis of left hip 12/16/2016  . Healthcare maintenance 09/29/2016  . Diarrhea 07/04/2016  . Hip pain, bilateral 10/09/2015  . Medicare annual wellness visit, subsequent 01/28/2015  . Advance care planning 01/28/2015  . HLD (hyperlipidemia) 01/16/2015  . Diverticulitis of colon without hemorrhage 06/07/2014  . Colon cancer screening 12/15/2012  . Skin lesion 12/15/2012    Past Medical History:  Diagnosis Date  . 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 Arthritis    Osteoarthritis  . Diverticulitis   . Diverticulosis of colon without hemorrhage   . GERD (gastroesophageal reflux disease)   . HOH (hard of hearing)   . Hyperlipidemia      Transfusion: none   Consultants (if any):   Discharged Condition: Improved  Hospital Course: Linda Juarez is an 83 y.o. female who was admitted 12/16/2016 with a diagnosis of <principal problem not specified> and went to the operating room on 12/16/2016 and underwent the above named procedures.    Surgeries: Procedure(s): TOTAL HIP ARTHROPLASTY ANTERIOR APPROACH on 12/16/2016 Patient tolerated the surgery well. Taken to PACU where she was stabilized and then transferred to the orthopedic floor.  Started on Lovenox 40 q 24 hrs. Foot pumps applied bilaterally at 80 mm. Heels elevated on bed with rolled towels. No evidence of DVT. Negative Homan. Physical therapy started on day #1 for gait training and transfer. OT started day #1 for ADL and assisted devices.  Patient's foley was d/c on day #1. Patient's IV and hemovac was d/c on day  #2.  On post op day #2 patient was stable and ready for discharge to SNF.  Implants: Medacta AMIS 2 femur standard stem, 54 mm Mpact DM with liner and S 28 mm head  She was given perioperative antibiotics:  Anti-infectives    Start     Dose/Rate Route Frequency Ordered Stop   12/16/16 2000  ceFAZolin (ANCEF) IVPB 1 g/50 mL premix     1 g 100 mL/hr over 30 Minutes Intravenous Every 6 hours 12/16/16 1751 12/17/16 0957   12/16/16 1113  ceFAZolin (ANCEF) 2-4 GM/100ML-% IVPB    Comments:  SCHNIER, JULIE: cabinet override      12/16/16 1113 12/16/16 1340   12/16/16 0045  ceFAZolin (ANCEF) IVPB 2g/100 mL premix     2 g 200 mL/hr over 30 Minutes Intravenous  Once 12/16/16 0033 12/16/16 1355    .  She was given sequential compression devices, early ambulation, and Loveonx for DVT prophylaxis.  She benefited maximally from the hospital stay and there were no complications.    Recent vital signs:  Vitals:   12/17/16 0936 12/17/16 1342  BP: (!) 90/52 (!) 134/58  Pulse:  79  Resp:    Temp:  98.2 F (36.8 C)    Recent laboratory studies:  Lab Results  Component Value Date   HGB 11.4 (L) 12/17/2016   HGB 12.6 12/16/2016   HGB 14.0 12/10/2016   Lab Results  Component Value Date   WBC 9.5 12/17/2016   PLT 190 12/17/2016   Lab Results  Component Value Date   INR 0.94 12/10/2016   Lab Results  Component  Value Date   NA 137 12/17/2016   K 3.9 12/17/2016   CL 106 12/17/2016   CO2 26 12/17/2016   BUN 16 12/17/2016   CREATININE 0.74 12/17/2016   GLUCOSE 127 (H) 12/17/2016    Discharge Medications:   Allergies as of 12/17/2016   No Known Allergies     Medication List    TAKE these medications   acetaminophen 500 MG tablet Commonly known as:  TYLENOL Take 500 mg by mouth 2 (two) times daily. Takes with ibuprofen   ADVIL 200 MG tablet Generic drug:  ibuprofen Take 200 mg by mouth 2 (two) times daily. Takes with the acetaminophen   CENTRUM SILVER ADULT 50+ Tabs Take  1 tablet by mouth daily.   enoxaparin 40 MG/0.4ML injection Commonly known as:  LOVENOX Inject 0.4 mLs (40 mg total) into the skin daily. Start taking on:  2/95/2841   Garlic 3244 MG Caps Take 1,000 mg by mouth every evening.   oxyCODONE 5 MG immediate release tablet Commonly known as:  Oxy IR/ROXICODONE Take 1-2 tablets (5-10 mg total) by mouth every 3 (three) hours as needed for breakthrough pain.   PROBIOTIC DAILY PO Take 1 tablet by mouth daily.            Durable Medical Equipment        Start     Ordered   12/16/16 1752  DME 3 n 1  Once     12/16/16 1751   12/16/16 1752  DME Bedside commode  Once    Question:  Patient needs a bedside commode to treat with the following condition  Answer:  Status post total replacement of left hip   12/16/16 1751   12/16/16 1752  DME Walker rolling  Once    Question:  Patient needs a walker to treat with the following condition  Answer:  Status post total hip replacement, left   12/16/16 1751      Diagnostic Studies: Dg Hip Operative Unilat W Or W/o Pelvis Left  Result Date: 12/16/2016 CLINICAL DATA:  Left hip replacement EXAM: OPERATIVE left HIP (WITH PELVIS IF PERFORMED) 6 VIEWS TECHNIQUE: Fluoroscopic spot image(s) were submitted for interpretation post-operatively. COMPARISON:  10/09/2015 FINDINGS: No laterality marker on the provided images. Left side exam is assumed for the purpose of interpretation. Initial image shows degenerative changes in the hip. Next 2 images show femoral sizing for left hip replacement. Final 3 images show prosthesis in place without evidence for immediate complicating features. IMPRESSION: Status post left hip replacement without evidence for complicating features. Electronically Signed   By: Misty Stanley M.D.   On: 12/16/2016 15:23   Dg Hip Unilat W Or W/o Pelvis 2-3 Views Left  Result Date: 12/16/2016 CLINICAL DATA:  Left hip replacement EXAM: DG HIP (WITH OR WITHOUT PELVIS) 2-3V LEFT COMPARISON:   None. FINDINGS: Total left hip arthroplasty without failure complication. Postsurgical changes in the surrounding soft tissues. No acute fracture or dislocation. IMPRESSION: Total left hip arthroplasty. Electronically Signed   By: Kathreen Devoid   On: 12/16/2016 16:02    Disposition: 01-Home or Self Care    Contact information for after-discharge care    Destination    [facility] .   Specialty:  [place] information: [address] [place] [phone]             Follow-up with [facility] orthopedics in 2 weeks for staple removal Keep dressing clean and dry.Change dressing as needed Lovenox  40 mg subcutaneous daily 14 days Continue with TED hose 6 weeks wear during the day removed at nighttime    Signed: [place] 12/17/2016, 6:27 PM

## 2016-12-17 NOTE — Clinical Social Work Note (Signed)
Clinical Social Work Assessment  Patient Details  Name: Linda Juarez MRN: [id number] Date of Birth: 09/27/1931  Date of referral:  12/17/16               Reason for consult:  Facility Placement                Permission sought to share information with:  Facility Contact Representative Permission granted to share information::  Yes, Verbal Permission Granted  Name::      Skilled Nursing Facility   Agency::   [place]   Relationship::     Contact Information:     Housing/Transportation Living arrangements for the past 2 months:  Independent Living Facility Source of Information:  Patient, Friend/Neighbor Patient Interpreter Needed:  None Criminal Activity/Legal Involvement Pertinent to Current Situation/Hospitalization:  No - Comment as needed Significant Relationships:  Other Family Members, Friend Lives with:  Self Do you feel safe going back to the place where you live?  Yes Need for family participation in patient care:  Yes (Comment)  Care giving concerns:  Patient lives at [facility] living [facility] in [place].    Social Worker assessment / plan:  Clinical Social Worker (CSW) received SNF consult. PT recommended home health however the recommendation changed to SNF. CSW met with patient at bedside to discuss D/C plan. Patient was alert and oriented and was sitting up in the chair. Patient reported that she lives alone at [facility] in [place]. Patient reported that her nephew Linda Juarez is her HPOA. CSW explained that PT is recommending SNF. Patient is agreeable to SNF search in [place]. FL2 complete and faxed out. CSW presented bed offers and patient chose [facility]. [facility] liaison is aware of accepted bed offer. MD aware of above. CSW will continue to follow and assist as needed.    Employment status:  Retired Insurance information:  Managed Medicare PT Recommendations:  Skilled Nursing  Facility Information / Referral to community resources:  Skilled Nursing Facility  Patient/Family's Response to care: Patient is agreeable to go to [facility] for rehab.   Patient/Family's Understanding of and Emotional Response to Diagnosis, Current Treatment, and Prognosis:  Patient was very pleasant and thanked CSW for assistance.   Emotional Assessment Appearance:  Appears stated age Attitude/Demeanor/Rapport:    Affect (typically observed):  Adaptable, Accepting, Pleasant Orientation:  Oriented to Self, Oriented to Place, Oriented to  Time, Oriented to Situation Alcohol / Substance use:  Not Applicable Psych involvement (Current and /or in the community):  No (Comment)  Discharge Needs  Concerns to be addressed:  Discharge Planning Concerns Readmission within the last 30 days:  No Current discharge risk:  Dependent with Mobility Barriers to Discharge:  Continued Medical Work up   Sample, Bailey M, LCSW 12/17/2016, 1:57 PM  

## 2016-12-17 NOTE — Progress Notes (Signed)
   Subjective: 1 Day Post-Op Procedure(s) (LRB): TOTAL HIP ARTHROPLASTY ANTERIOR APPROACH (Left) Patient reports pain as mild.   Patient is well, and has had no acute complaints or problems Denies any CP, SOB, ABD pain. We will start therapy today.    Objective: Vital signs in last 24 hours: Temp:  [97.2 F (36.2 C)-98.1 F (36.7 C)] 97.9 F (36.6 C) (03/28 0356) Pulse Rate:  [59-124] 70 (03/28 0356) Resp:  [14-27] 19 (03/28 0356) BP: (103-207)/(53-96) 118/59 (03/28 0356) SpO2:  [98 %-100 %] 98 % (03/28 0356) Weight:  [75.8 kg (167 lb)] 75.8 kg (167 lb) (03/27 1144)  Intake/Output from previous day: 03/27 0701 - 03/28 0700 In: 2613.8 [P.O.:360; I.V.:2153.8; IV Piggyback:100] Out: 3888 [Urine:1450; Drains:15; Blood:300] Intake/Output this shift: No intake/output data recorded.   Recent Labs  12/16/16 1812 12/17/16 0338  HGB 12.6 11.4*    Recent Labs  12/16/16 1812 12/17/16 0338  WBC 15.0* 9.5  RBC 4.08 3.58*  HCT 37.2 32.7*  PLT 208 190    Recent Labs  12/16/16 1812 12/17/16 0338  NA  --  137  K  --  3.9  CL  --  106  CO2  --  26  BUN  --  16  CREATININE 0.78 0.74  GLUCOSE  --  127*  CALCIUM  --  8.3*   No results for input(s): LABPT, INR in the last 72 hours.  EXAM General - Patient is Alert, Appropriate and Oriented Extremity - Neurovascular intact Sensation intact distally Intact pulses distally Dorsiflexion/Plantar flexion intact No cellulitis present Compartment soft Dressing - dressing C/D/I and no drainage, hemovac intact Motor Function - intact, moving foot and toes well on exam.   Past Medical History:  Diagnosis Date  . 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 Arthritis    Osteoarthritis  . Diverticulitis   . Diverticulosis of colon without hemorrhage   . GERD (gastroesophageal reflux disease)   . HOH (hard of hearing)   . Hyperlipidemia      Assessment/Plan:   1 Day Post-Op Procedure(s) (LRB): TOTAL HIP ARTHROPLASTY ANTERIOR APPROACH (Left) Active Problems:   Primary localized osteoarthritis of left hip Acute post op blood loss anemia   Estimated body mass index is 26.95 kg/m as calculated from the following:   Height as of this encounter: 5\' 6"  (1.676 m).   Weight as of this encounter: 75.8 kg (167 lb). Advance diet Up with therapy  Needs BM Acute post op blood loss anemia- recheck labs in the am CM to assist with discharge   DVT Prophylaxis - Lovenox, Foot Pumps and TED hose Weight-Bearing as tolerated to left leg   T. Rachelle Hora, PA-C [place] 12/17/2016, 7:07 AM

## 2016-12-17 NOTE — Progress Notes (Signed)
Clinical Social Worker (CSW) received SNF consult. PT is recommending home health. RN case manager aware of above. Please reconsult if future social work needs arise. CSW signing off.   Murrel Bertram, LCSW [phone] 

## 2016-12-17 NOTE — Clinical Social Work Placement (Signed)
   CLINICAL SOCIAL WORK PLACEMENT  NOTE  Date:  12/17/2016  Patient Details  Name: Linda Juarez MRN: [id number] Date of Birth: 09-05-1932  Clinical Social Work is seeking post-discharge placement for this patient at the [place] level of care (*CSW will initial, date and re-position this form in  chart as items are completed):  Yes   Patient/family provided with [place] Work Department's list of facilities offering this level of care within the geographic area requested by the patient (or if unable, by the patient's family).  Yes   Patient/family informed of their freedom to choose among providers that offer the needed level of care, that participate in Medicare, Medicaid or managed care program needed by the patient, have an available bed and are willing to accept the patient.  Yes   Patient/family informed of [facility]'s ownership interest in [facility] and [facility], as well as of the fact that they are under no obligation to receive care at these facilities.  PASRR submitted to EDS on 12/16/16     PASRR number received on 12/16/16     Existing PASRR number confirmed on       FL2 transmitted to all facilities in geographic area requested by pt/family on 12/16/16     FL2 transmitted to all facilities within larger geographic area on       Patient informed that his/her managed care company has contracts with or will negotiate with certain facilities, including the following:        Yes   Patient/family informed of bed offers received.  Patient chooses bed at  ([facility] )     Physician recommends and patient chooses bed at      Patient to be transferred to   on  .  Patient to be transferred to facility by       Patient family notified on   of transfer.  Name of family member notified:        PHYSICIAN       Additional Comment:    _______________________________________________ Mahlik Lenn, Veronia Beets, LCSW 12/17/2016, 1:56  PM

## 2016-12-18 LAB — CBC
HCT: 32.5 % — ABNORMAL LOW (ref 35.0–47.0)
HEMOGLOBIN: 11.4 g/dL — AB (ref 12.0–16.0)
MCH: 32.1 pg (ref 26.0–34.0)
MCHC: 35 g/dL (ref 32.0–36.0)
MCV: 91.8 fL (ref 80.0–100.0)
PLATELETS: 172 10*3/uL (ref 150–440)
RBC: 3.54 MIL/uL — AB (ref 3.80–5.20)
RDW: 12.7 % (ref 11.5–14.5)
WBC: 9.2 10*3/uL (ref 3.6–11.0)

## 2016-12-18 LAB — SURGICAL PATHOLOGY

## 2016-12-18 LAB — BASIC METABOLIC PANEL
Anion gap: 5 (ref 5–15)
BUN: 16 mg/dL (ref 6–20)
CHLORIDE: 108 mmol/L (ref 101–111)
CO2: 28 mmol/L (ref 22–32)
Calcium: 8.3 mg/dL — ABNORMAL LOW (ref 8.9–10.3)
Creatinine, Ser: 0.77 mg/dL (ref 0.44–1.00)
GFR calc Af Amer: >60 mL/min (ref >60)
GFR calc non Af Amer: >60 mL/min (ref >60)
GLUCOSE: 119 mg/dL — AB (ref 65–99)
POTASSIUM: 3.7 mmol/L (ref 3.5–5.1)
Sodium: 141 mmol/L (ref 135–145)

## 2016-12-18 NOTE — Progress Notes (Signed)
   Subjective: 2 Days Post-Op Procedure(s) (LRB): TOTAL HIP ARTHROPLASTY ANTERIOR APPROACH (Left) Patient reports pain as mild.   Patient is well, and has had no acute complaints or problems Denies any CP, SOB, ABD pain. We will continue therapy today.    Objective: Vital signs in last 24 hours: Temp:  [98.2 F (36.8 C)-98.8 F (37.1 C)] 98.8 F (37.1 C) (03/28 2208) Pulse Rate:  [79-81] 81 (03/28 2208) Resp:  [18] 18 (03/28 2208) BP: (90-149)/(52-60) 149/60 (03/28 2208) SpO2:  [94 %-99 %] 94 % (03/28 2208)  Intake/Output from previous day: 03/28 0701 - 03/29 0700 In: 1547.5 [P.O.:480; I.V.:1017.5; IV Piggyback:50] Out: 50 [Drains:50] Intake/Output this shift: No intake/output data recorded.   Recent Labs  12/16/16 1812 12/17/16 0338 12/18/16 0441  HGB 12.6 11.4* 11.4*    Recent Labs  12/17/16 0338 12/18/16 0441  WBC 9.5 9.2  RBC 3.58* 3.54*  HCT 32.7* 32.5*  PLT 190 172    Recent Labs  12/17/16 0338 12/18/16 0441  NA 137 141  K 3.9 3.7  CL 106 108  CO2 26 28  BUN 16 16  CREATININE 0.74 0.77  GLUCOSE 127* 119*  CALCIUM 8.3* 8.3*   No results for input(s): LABPT, INR in the last 72 hours.  EXAM General - Patient is Alert, Appropriate and Oriented Extremity - Neurovascular intact Sensation intact distally Intact pulses distally Dorsiflexion/Plantar flexion intact No cellulitis present Compartment soft Dressing - dressing C/D/I and scant drainage, hemovac removed Motor Function - intact, moving foot and toes well on exam.   Past Medical History:  Diagnosis Date  . 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 Arthritis    Osteoarthritis  . Diverticulitis   . Diverticulosis of colon without hemorrhage   . GERD (gastroesophageal reflux disease)   . HOH (hard of hearing)   . Hyperlipidemia     Assessment/Plan:   2 Days Post-Op Procedure(s) (LRB): TOTAL HIP ARTHROPLASTY  ANTERIOR APPROACH (Left) Active Problems:   Primary localized osteoarthritis of left hip Acute post op blood loss anemia   Estimated body mass index is 26.95 kg/m as calculated from the following:   Height as of this encounter: 5\' 6"  (1.676 m).   Weight as of this encounter: 75.8 kg (167 lb). Advance diet Up with therapy  Discharge to rehabilitation facility today for a bowel movement. Follow-up with [facility] orthopedics in 2 weeks for staple removal Keep dressing clean and dry. Lovenox 40 mg subcutaneous daily 14 days Continue with TED hose 6 weeks wear during the day removed at nighttime   DVT Prophylaxis - Lovenox, Foot Pumps and TED hose Weight-Bearing as tolerated to left leg   T. Rachelle Hora, PA-C [place] 12/18/2016, 8:03 AM

## 2016-12-18 NOTE — Plan of Care (Signed)
Problem: Safety: Goal: Ability to remain free from injury will improve Outcome: Progressing Remaining free from falls. Pt alert and oriented and able to ring for assistance  Problem: Skin Integrity: Goal: Risk for impaired skin integrity will decrease Outcome: Progressing Surgical dressing remaining dry and intact.  Problem: Fluid Volume: Goal: Ability to maintain a balanced intake and output will improve Outcome: Progressing Eating and drinking without difficulty  Problem: Pain Management: Goal: Pain level will decrease with appropriate interventions Outcome: Progressing Pain controled with oral pain medication.

## 2016-12-18 NOTE — Progress Notes (Signed)
Patient was discharged to [facility] via EMS. IVs removed with cath intact. Called report. Belongings and paperwork sent with patient.

## 2016-12-18 NOTE — Clinical Social Work Placement (Signed)
   CLINICAL SOCIAL WORK PLACEMENT  NOTE  Date:  12/18/2016  Patient Details  Name: Linda Juarez MRN: [id number] Date of Birth: 06-23-1932  Clinical Social Work is seeking post-discharge placement for this patient at the [place] level of care (*CSW will initial, date and re-position this form in  chart as items are completed):  Yes   Patient/family provided with [place] Work Department's list of facilities offering this level of care within the geographic area requested by the patient (or if unable, by the patient's family).  Yes   Patient/family informed of their freedom to choose among providers that offer the needed level of care, that participate in Medicare, Medicaid or managed care program needed by the patient, have an available bed and are willing to accept the patient.  Yes   Patient/family informed of 's ownership interest in [facility] and [facility], as well as of the fact that they are under no obligation to receive care at these facilities.  PASRR submitted to EDS on 12/16/16     PASRR number received on 12/16/16     Existing PASRR number confirmed on       FL2 transmitted to all facilities in geographic area requested by pt/family on 12/16/16     FL2 transmitted to all facilities within larger geographic area on       Patient informed that his/her managed care company has contracts with or will negotiate with certain facilities, including the following:        Yes   Patient/family informed of bed offers received.  Patient chooses bed at  ([facility] )     Physician recommends and patient chooses bed at      Patient to be transferred to  ([facility]) on 12/18/16.  Patient to be transferred to facility by  [facility] )     Patient family notified on 12/18/16 of transfer.  Name of family member notified:   (Patient's neice Butch Penny is aware of patient's discharge today. )     PHYSICIAN        Additional Comment:    _______________________________________________ Danie Chandler, Rebecca Work 12/18/2016, 8:49 AM

## 2016-12-18 NOTE — Progress Notes (Signed)
Patient is medically stable for discharge today to [facility]. Per [facility], patient can come to room 810. Clinical Education officer, museum (CSW) sent discharge orders in [place]. Social work Theatre manager met with patient alone at bedside, making her aware of discharge today to [employer] [employer]. Patient verbally agreed she understood. Social work Theatre manager spoke to patient's niece Butch Penny making her aware of patient's discharge today. RN will call report and arrange EMS for transport. Please re-consult if future social work needs arise. Social work Architectural technologist off.   Danie Chandler, Social Work Intern  [phone]

## 2017-01-08 DIAGNOSIS — Z7689 Persons encountering health services in other specified circumstances: Secondary | ICD-10-CM | POA: Diagnosis not present

## 2017-01-13 ENCOUNTER — Ambulatory Visit (INDEPENDENT_AMBULATORY_CARE_PROVIDER_SITE_OTHER): Payer: Medicare Other | Admitting: Family Medicine

## 2017-01-13 ENCOUNTER — Encounter: Payer: Self-pay | Admitting: Family Medicine

## 2017-01-13 VITALS — BP 116/62 | HR 72 | Temp 98.0°F | Wt 170.0 lb

## 2017-01-13 DIAGNOSIS — Z7189 Other specified counseling: Secondary | ICD-10-CM

## 2017-01-13 DIAGNOSIS — M1612 Unilateral primary osteoarthritis, left hip: Secondary | ICD-10-CM | POA: Diagnosis not present

## 2017-01-13 DIAGNOSIS — R609 Edema, unspecified: Secondary | ICD-10-CM

## 2017-01-13 LAB — CBC WITH DIFFERENTIAL/PLATELET
BASOS ABS: 0 10*3/uL (ref 0.0–0.1)
Basophils Relative: 0.7 % (ref 0.0–3.0)
Eosinophils Absolute: 0.4 10*3/uL (ref 0.0–0.7)
Eosinophils Relative: 7 % — ABNORMAL HIGH (ref 0.0–5.0)
HEMATOCRIT: 35.3 % — AB (ref 36.0–46.0)
Hemoglobin: 11.6 g/dL — ABNORMAL LOW (ref 12.0–15.0)
LYMPHS ABS: 1.3 10*3/uL (ref 0.7–4.0)
Lymphocytes Relative: 23.5 % (ref 12.0–46.0)
MCHC: 32.8 g/dL (ref 30.0–36.0)
MCV: 92.5 fl (ref 78.0–100.0)
Monocytes Absolute: 0.5 10*3/uL (ref 0.1–1.0)
Monocytes Relative: 9.5 % (ref 3.0–12.0)
NEUTROS ABS: 3.4 10*3/uL (ref 1.4–7.7)
NEUTROS PCT: 59.3 % (ref 43.0–77.0)
PLATELETS: 240 10*3/uL (ref 150.0–400.0)
RBC: 3.82 Mil/uL — ABNORMAL LOW (ref 3.87–5.11)
RDW: 13.1 % (ref 11.5–15.5)
WBC: 5.7 10*3/uL (ref 4.0–10.5)

## 2017-01-13 LAB — COMPREHENSIVE METABOLIC PANEL
ALT: 24 U/L (ref 0–35)
AST: 22 U/L (ref 0–37)
Albumin: 4 g/dL (ref 3.5–5.2)
Alkaline Phosphatase: 109 U/L (ref 39–117)
BILIRUBIN TOTAL: 0.3 mg/dL (ref 0.2–1.2)
BUN: 27 mg/dL — AB (ref 6–23)
CO2: 29 meq/L (ref 19–32)
CREATININE: 0.92 mg/dL (ref 0.40–1.20)
Calcium: 9.7 mg/dL (ref 8.4–10.5)
Chloride: 105 mEq/L (ref 96–112)
GFR: 61.99 mL/min (ref >60.00)
GLUCOSE: 126 mg/dL — AB (ref 70–99)
Potassium: 4.4 mEq/L (ref 3.5–5.1)
SODIUM: 140 meq/L (ref 135–145)
Total Protein: 6.9 g/dL (ref 6.0–8.3)

## 2017-01-13 NOTE — Patient Instructions (Signed)
Go to the lab on the way out.  We'll contact you with your lab report. You are doing really well.  Take care.  Update me as needed.

## 2017-01-13 NOTE — Assessment & Plan Note (Addendum)
Advance directive d/w pt- nephew Jonn Shingles designated if patient were incapacitated.  She had filled out 5 wishes forms, scanned.

## 2017-01-13 NOTE — Progress Notes (Signed)
S/p L hip replacement.  She has some BLE edema and some L thigh numbness.  Her pain is clearly better than prior to the operation.  S/p [facility] rehab stay.  She was treated well and had frequent PT.  She is back to independent except for driving.  Walking with cane.  Getting in and out of bed was/is the most difficult for her.  She is still going to do PT at home.  That is already scheduled and she didn't need an order.    Advance directive d/w pt- nephew Jonn Shingles designated if patient were incapacitated.  She had filled out 5 wishes, scanned.    She has moved in the meantime.  Her new home is working out well for her.    Off oxycodone in the meantime.  Taking ibuprofen 400mg  BID with tylenol in the middle of the day.    Appetite is good. Normal BMs.   PMH and SH reviewed  ROS: Per HPI unless specifically indicated in ROS section   Meds, vitals, and allergies reviewed.   GEN: nad, alert and oriented HEENT: mucous membranes moist NECK: supple w/o LA CV: rrr. PULM: ctab, no inc wob ABD: soft, +bs EXT: trace ble edema SKIN: no acute rash Walking with cane.

## 2017-01-13 NOTE — Progress Notes (Signed)
Pre visit review using our clinic review tool, if applicable. No additional management support is needed unless otherwise documented below in the visit note. 

## 2017-01-14 DIAGNOSIS — R609 Edema, unspecified: Secondary | ICD-10-CM | POA: Insufficient documentation

## 2017-01-14 NOTE — Assessment & Plan Note (Signed)
Status post hip replacement and doing well. She is going to continue PT at home. She is taking reasonable amounts of ibuprofen and Tylenol. Off opiates. Appetite is good. Normal bowel movements.  Her pain is much better. We talked about her hospital and rehabilitation course in general and all questions were answered. I appreciate the help of all involved. >25 minutes spent in face to face time with patient, >50% spent in counselling or coordination of care.

## 2017-01-14 NOTE — Assessment & Plan Note (Signed)
Small amount of edema noted. This is likely benign dependent edema. Elevate legs when possible. Continue with routine activity. Check routine labs today. She agrees.

## 2017-01-16 ENCOUNTER — Telehealth: Payer: Self-pay

## 2017-01-16 NOTE — Telephone Encounter (Signed)
Left detailed message on VM but patient didn't know how to retrieve it so she called in after hours.  Message was delivered and understood.

## 2017-01-16 NOTE — Telephone Encounter (Signed)
PLEASE NOTE: All timestamps contained within this report are represented as [place] Standard Time. CONFIDENTIALTY NOTICE: This fax transmission is intended only for the addressee. It contains information that is legally privileged, confidential or otherwise protected from use or disclosure. If you are not the intended recipient, you are strictly prohibited from reviewing, disclosing, copying using or disseminating any of this information or taking any action in reliance on or regarding this information. If you have received this fax in error, please notify us immediately by telephone so that we can arrange for its return to [place]. Phone: [phone], Toll-Free: [phone], Fax: [phone] Page: 1 of 1 Call Id: [id number] Linda Juarez - Client Nonclinical Telephone Record Linda Juarez - Client Client Site Young Physician Renford Dills - MD Contact Type Call Who Is Calling Patient / Member / Family / Caregiver Caller Name Linda Juarez Caller Phone Number [phone] Call Type Message Only Information Provided Reason for Call Returning a Call from the Office Initial Comment Caller received a call regarding test results. Additional Comment Call Closed By: Nobie Putnam Transaction Date/Time: 01/15/2017 6:42:02 PM (ET)

## 2017-03-27 IMAGING — DX DG HIP (WITH OR WITHOUT PELVIS) 2-3V*L*
2 series · 2 of 2 positions shown · non-contrast
Comparison: None.

CLINICAL DATA: Left hip replacement

EXAM:
DG HIP (WITH OR WITHOUT PELVIS) 2-3V LEFT

[hip lat]
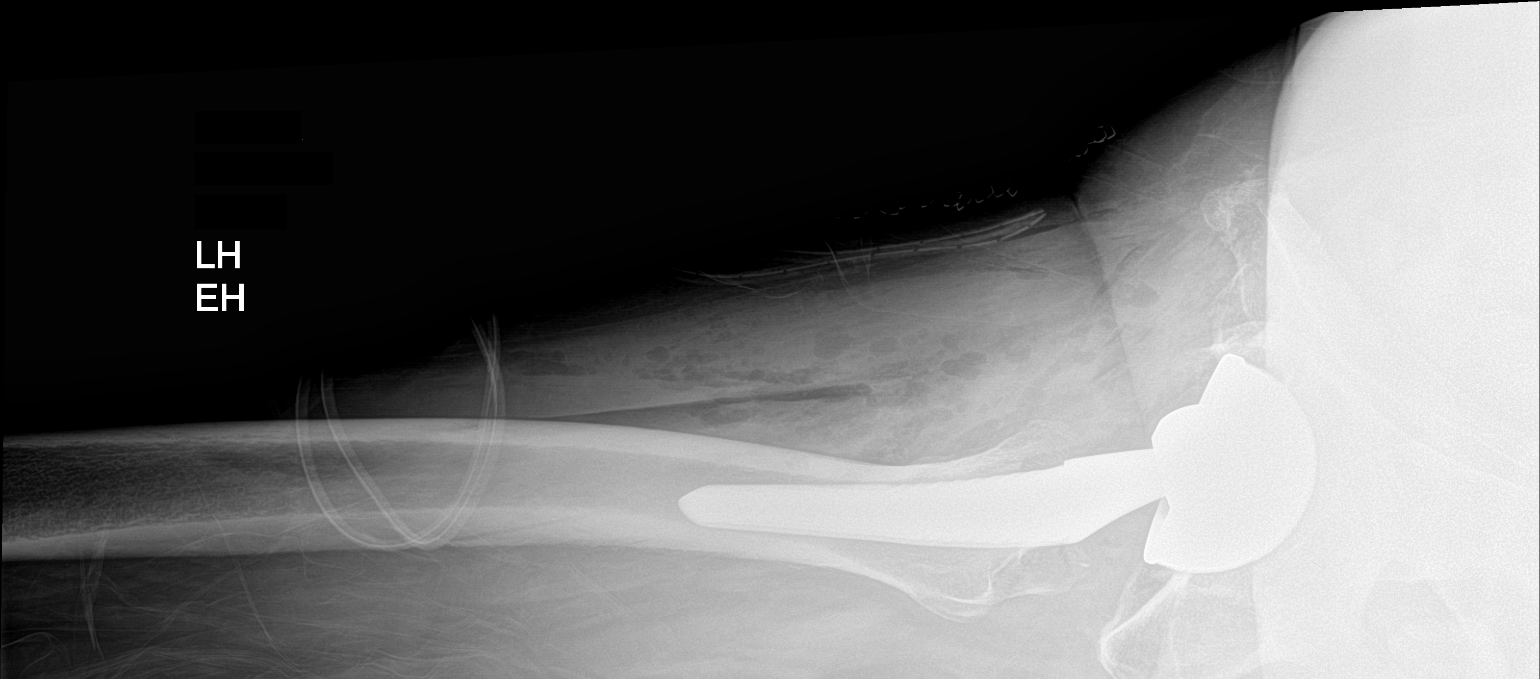

[hip ap]
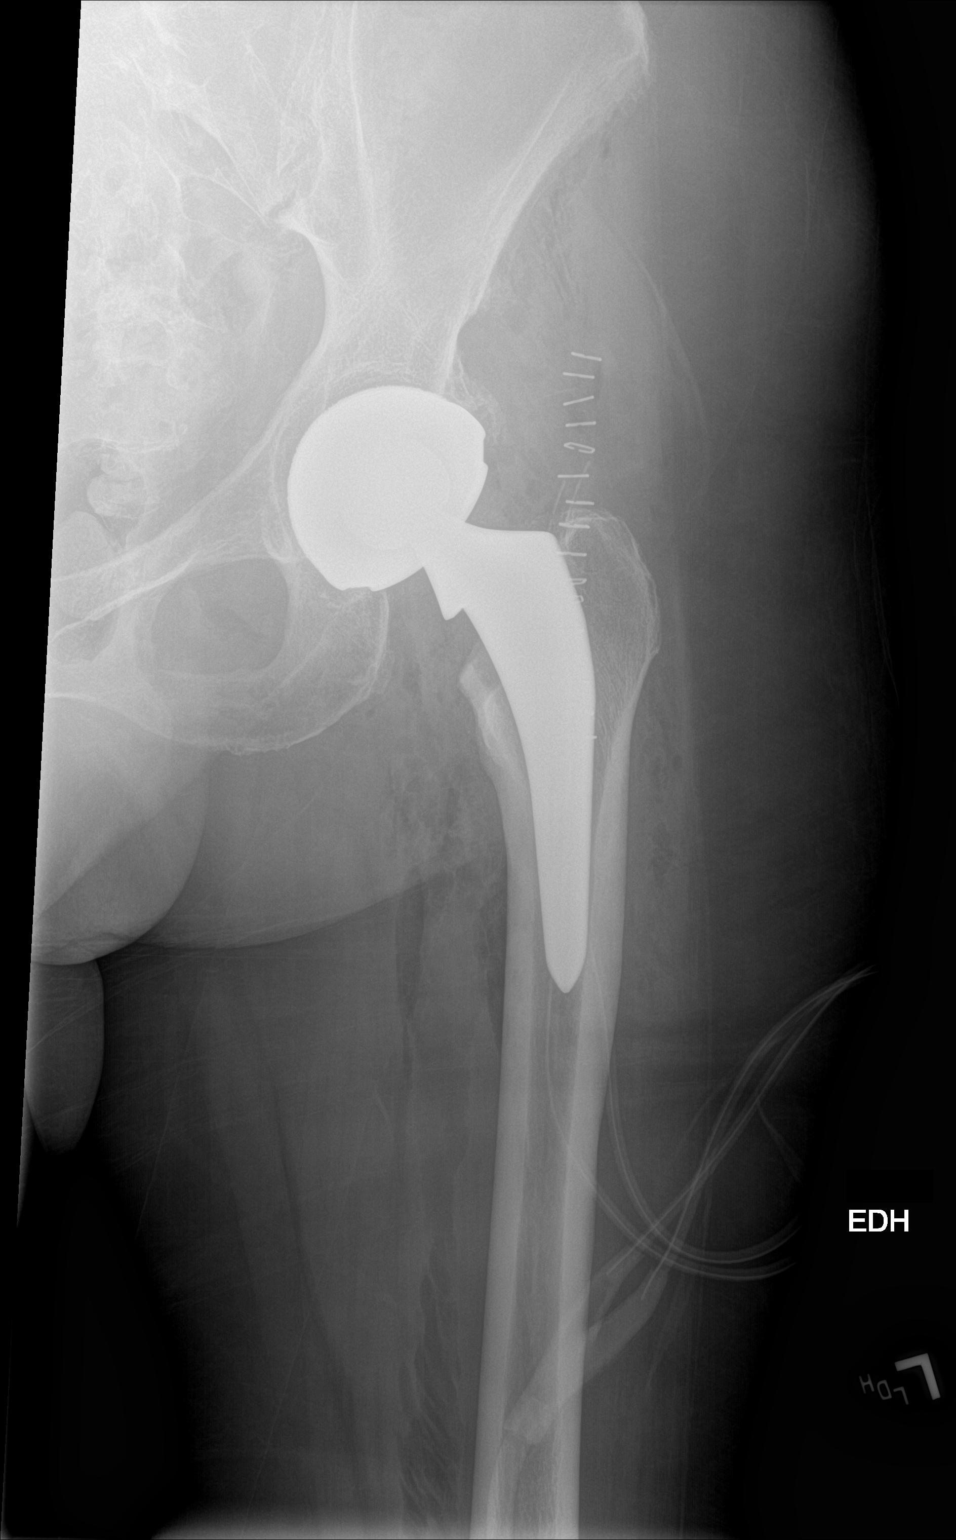

[2 of 2 positions shown; findings below may reference images not displayed]

FINDINGS: Total left hip arthroplasty without failure complication.
Postsurgical changes in the surrounding soft tissues. No acute
fracture or dislocation.
IMPRESSION: Total left hip arthroplasty.

## 2017-07-07 ENCOUNTER — Encounter: Payer: Self-pay | Admitting: Family Medicine

## 2017-08-06 HISTORY — PX: SKIN SURGERY: SHX2413

## 2017-09-23 ENCOUNTER — Other Ambulatory Visit: Payer: Medicare Other

## 2017-09-23 ENCOUNTER — Other Ambulatory Visit: Payer: Self-pay | Admitting: Family Medicine

## 2017-09-23 ENCOUNTER — Ambulatory Visit (INDEPENDENT_AMBULATORY_CARE_PROVIDER_SITE_OTHER): Payer: Medicare Other

## 2017-09-23 ENCOUNTER — Ambulatory Visit: Payer: Medicare Other

## 2017-09-23 VITALS — BP 110/80 | HR 63 | Temp 97.9°F | Ht 66.0 in | Wt 163.0 lb

## 2017-09-23 DIAGNOSIS — E785 Hyperlipidemia, unspecified: Secondary | ICD-10-CM

## 2017-09-23 DIAGNOSIS — Z0000 Encounter for general adult medical examination without abnormal findings: Secondary | ICD-10-CM | POA: Diagnosis not present

## 2017-09-23 DIAGNOSIS — D649 Anemia, unspecified: Secondary | ICD-10-CM | POA: Diagnosis not present

## 2017-09-23 LAB — COMPREHENSIVE METABOLIC PANEL
ALBUMIN: 4.2 g/dL (ref 3.5–5.2)
ALK PHOS: 104 U/L (ref 39–117)
ALT: 21 U/L (ref 0–35)
AST: 24 U/L (ref 0–37)
BUN: 18 mg/dL (ref 6–23)
CO2: 28 mEq/L (ref 19–32)
CREATININE: 0.85 mg/dL (ref 0.40–1.20)
Calcium: 9.2 mg/dL (ref 8.4–10.5)
Chloride: 105 mEq/L (ref 96–112)
GFR: 67.8 mL/min (ref >60.00)
Glucose, Bld: 86 mg/dL (ref 70–99)
Potassium: 4.2 mEq/L (ref 3.5–5.1)
SODIUM: 141 meq/L (ref 135–145)
TOTAL PROTEIN: 7 g/dL (ref 6.0–8.3)
Total Bilirubin: 0.7 mg/dL (ref 0.2–1.2)

## 2017-09-23 LAB — CBC WITH DIFFERENTIAL/PLATELET
BASOS ABS: 0 10*3/uL (ref 0.0–0.1)
Basophils Relative: 0.5 % (ref 0.0–3.0)
EOS ABS: 0.2 10*3/uL (ref 0.0–0.7)
Eosinophils Relative: 2.2 % (ref 0.0–5.0)
HEMATOCRIT: 41 % (ref 36.0–46.0)
Hemoglobin: 13.4 g/dL (ref 12.0–15.0)
LYMPHS ABS: 1.9 10*3/uL (ref 0.7–4.0)
LYMPHS PCT: 26.1 % (ref 12.0–46.0)
MCHC: 32.7 g/dL (ref 30.0–36.0)
MCV: 91.8 fl (ref 78.0–100.0)
MONOS PCT: 7.4 % (ref 3.0–12.0)
Monocytes Absolute: 0.5 10*3/uL (ref 0.1–1.0)
NEUTROS PCT: 63.8 % (ref 43.0–77.0)
Neutro Abs: 4.5 10*3/uL (ref 1.4–7.7)
Platelets: 227 10*3/uL (ref 150.0–400.0)
RBC: 4.47 Mil/uL (ref 3.87–5.11)
RDW: 13.5 % (ref 11.5–15.5)
WBC: 7.1 10*3/uL (ref 4.0–10.5)

## 2017-09-23 LAB — LIPID PANEL
Cholesterol: 207 mg/dL — ABNORMAL HIGH (ref 0–200)
HDL: 39.6 mg/dL (ref >39.00)
LDL Cholesterol: 136 mg/dL — ABNORMAL HIGH (ref 0–99)
NONHDL: 167.34
Total CHOL/HDL Ratio: 5
Triglycerides: 159 mg/dL — ABNORMAL HIGH (ref 0.0–149.0)
VLDL: 31.8 mg/dL (ref 0.0–40.0)

## 2017-09-23 NOTE — Progress Notes (Signed)
Subjective:   DELENE MORAIS is a 84 y.o. female who presents for Medicare Annual (Subsequent) preventive examination.  Review of Systems:  N/A Cardiac Risk Factors include: advanced age (>32men, >36 women);dyslipidemia     Objective:     Vitals: BP 110/80 (BP Location: Right Arm, Patient Position: Sitting, Cuff Size: Normal)   Pulse 63   Temp 97.9 F (36.6 C) (Oral)   Ht 5\' 6"  (1.676 m) Comment: no shoes  Wt 163 lb (73.9 kg)   SpO2 98%   BMI 26.31 kg/m   Body mass index is 26.31 kg/m.  Advanced Directives 09/23/2017 12/16/2016 12/10/2016 09/18/2016  Does Patient Have a Medical Advance Directive? Yes Yes No No  Type of Paramedic of [place];Living will Living will - -  Does patient want to make changes to medical advance directive? - No - Patient declined No - Patient declined -  Copy of [place] in Chart? Yes - - -    Tobacco Social History   Tobacco Use  Smoking Status Former Smoker  . Packs/day: 0.50  . Types: Cigarettes  . Last attempt to quit: 09/22/1986  . Years since quitting: 31.0  Smokeless Tobacco Never Used     Counseling given: No   Clinical Intake:  Pre-visit preparation completed: Yes  Pain : No/denies pain Pain Score: 0-No pain     Nutritional Status: BMI 25 -29 Overweight Nutritional Risks: None Diabetes: No  How often do you need to have someone help you when you read instructions, pamphlets, or other written materials from your doctor or pharmacy?: 1 - Never What is the last grade level you completed in school?: 12th grade + 1 yr college  Interpreter Needed?: No  Comments: pt is a widow and lives in assisted living facility Information entered by :: LPinson, LPN  Past Medical History:  Diagnosis Date  . 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 Arthritis    Osteoarthritis  . Diverticulitis   . Diverticulosis of  colon without hemorrhage   . GERD (gastroesophageal reflux disease)   . HOH (hard of hearing)   . Hyperlipidemia    Past Surgical History:  Procedure Laterality Date  . ABDOMINAL HYSTERECTOMY  1980's  . CATARACT EXTRACTION Left 2015  . CATARACT EXTRACTION W/PHACO Right 06/04/2015   Procedure: CATARACT EXTRACTION PHACO AND INTRAOCULAR LENS PLACEMENT ([place]);  Surgeon: Estill Cotta, MD;  Location: [facility] ORS;  Service: Ophthalmology;  Laterality: Right;  LOT# [id number] H [place]: 01:39.5 AP: 25.9% CDE: 42.63  . CATARACT EXTRACTION, BILATERAL    . CHOLECYSTECTOMY  2006  . SKIN SURGERY  08/06/2017   Dr. Nehemiah Massed  . Stress Cardiolite  04/24/11   Normal perfusion study with no stress induced ischemia  . TONSILLECTOMY AND ADENOIDECTOMY    . TOTAL HIP ARTHROPLASTY Left 12/16/2016   Procedure: TOTAL HIP ARTHROPLASTY ANTERIOR APPROACH;  Surgeon: Hessie Knows, MD;  Location: [facility] ORS;  Service: Orthopedics;  Laterality: Left;   Family History  Problem Relation Age of Onset  . Stroke Mother   . COPD Sister   . Thyroid disease Sister   . Heart disease Brother   . Thyroid disease Other   . Colon cancer Neg Hx   . Colon polyps Neg Hx   . Diabetes Neg Hx   . Kidney disease Neg Hx   . Esophageal cancer Neg Hx   . Breast cancer Neg Hx  Social History   Socioeconomic History  . Marital status: Married    Spouse name: None  . Number of children: 0  . Years of education: None  . Highest education level: None  Social Needs  . Financial resource strain: None  . Food insecurity - worry: None  . Food insecurity - inability: None  . Transportation needs - medical: None  . Transportation needs - non-medical: None  Occupational History  . Occupation: Retired    Fish farm manager: UNEMPLOYED  Tobacco Use  . Smoking status: Former Smoker    Packs/day: 0.50    Types: Cigarettes    Last attempt to quit: 09/22/1986    Years since quitting: 31.0  . Smokeless tobacco: Never Used  Substance and Sexual Activity   . Alcohol use: Yes    Comment: Occassionally-rare  . Drug use: No  . Sexual activity: None  Other Topics Concern  . None  Social History Narrative   Married 1977, widowed 2015- husband had cancer   2 step children   retired    Outpatient Encounter Medications as of 09/23/2017  Medication Sig  . acetaminophen (TYLENOL) 500 MG tablet Take 500 mg by mouth daily.   Marland Kitchen GARLIC PO Take 834 mg by mouth every evening.  Marland Kitchen ibuprofen (ADVIL) 200 MG tablet Take 200 mg by mouth daily.   . Multiple Vitamins-Minerals (CENTRUM SILVER ADULT 50+) TABS Take 1 tablet by mouth every morning.   . Probiotic Product (PROBIOTIC DAILY PO) Take 1 tablet by mouth every evening.   . [DISCONTINUED] Garlic 1962 MG CAPS Take 1,000 mg by mouth every morning.    No facility-administered encounter medications on file as of 09/23/2017.     Activities of Daily Living In your present state of health, do you have any difficulty performing the following activities: 09/23/2017 12/16/2016  Hearing? Y Y  Comment - -  Vision? N N  Difficulty concentrating or making decisions? N N  Walking or climbing stairs? Y N  Dressing or bathing? N Y  Comment - -  Doing errands, shopping? N Y  Conservation officer, nature and eating ? N -  Using the Toilet? N -  In the past six months, have you accidently leaked urine? Y -  Do you have problems with loss of bowel control? N -  Managing your Medications? N -  Managing your Finances? N -  Housekeeping or managing your Housekeeping? N -  Some recent data might be hidden    Patient Care Team: Tonia Ghent, MD as PCP - General (Family Medicine) Hessie Knows, MD as Consulting Physician (Orthopedic Surgery) Ralene Bathe, MD as Referring Physician (Dermatology) Estill Cotta, MD as Referring Physician (Ophthalmology)    Assessment:   This is a routine wellness examination for Landy.  Hearing Screening Comments: Bilateral hearing aids Vision Screening Comments: Last vision exam in  June 2018 with Dr. Sandra Cockayne  Exercise Activities and Dietary recommendations Current Exercise Habits: The patient does not participate in regular exercise at present, Exercise limited by: None identified  Goals    . DIET - INCREASE WATER INTAKE     Starting 09/23/2017, I will continue to drink at least 4-6 glasses of water daily.        Fall Risk Fall Risk  09/23/2017 09/18/2016 01/26/2015  Falls in the past year? Yes Yes No  Comment fell during snow storm pt had accidental fall while out shopping -  Number falls in past yr: 1 1 -  Injury with Fall? No No -  Depression Screen PHQ 2/9 Scores 09/23/2017 09/18/2016 01/26/2015  PHQ - 2 Score 0 0 0  PHQ- 9 Score 0 - -     Cognitive Function MMSE - Mini Mental State Exam 09/23/2017 09/18/2016  Orientation to time 5 5  Orientation to Place 5 5  Registration 3 3  Attention/ Calculation 0 0  Recall 3 3  Language- name 2 objects 0 0  Language- repeat 1 1  Language- follow 3 step command 3 3  Language- read & follow direction 0 0  Write a sentence 0 0  Copy design 0 0  Total score 20 20   Hearing Screening Comments: Bilateral hearing aids Vision Screening Comments: Last vision exam in June 2018 with Dr. Sandra Cockayne     Immunization History  Administered Date(s) Administered  . Influenza Whole 07/28/2013  . Influenza,inj,Quad PF,6+ Mos 10/09/2015, 09/18/2016  . Influenza-Unspecified 12/25/2014, 06/30/2017  . Pneumococcal Conjugate-13 01/26/2015  . Pneumococcal Polysaccharide-23 12/23/2012  . Td 12/23/2012  . Zoster 03/29/2009    Screening Tests Health Maintenance  Topic Date Due  . TETANUS/TDAP  12/24/2022  . INFLUENZA VACCINE  Completed  . DEXA SCAN  Completed  . PNA vac Low Risk Adult  Completed       Plan:     I have personally reviewed, addressed, and noted the following in the patient's chart:  A. Medical and social history B. Use of alcohol, tobacco or illicit drugs  C. Current medications and  supplements D. Functional ability and status E.  Nutritional status F.  Physical activity G. Advance directives H. List of other physicians I.  Hospitalizations, surgeries, and ER visits in previous 12 months J.  [place] to include hearing, vision, cognitive, depression L. Referrals and appointments - none  In addition, I have reviewed and discussed with patient certain preventive protocols, quality metrics, and best practice recommendations. A written personalized care plan for preventive services as well as general preventive health recommendations were provided to patient.  See attached scanned questionnaire for additional information.   Signed,   Lindell Noe, MHA, BS, LPN Health Coach

## 2017-09-23 NOTE — Progress Notes (Signed)
Pre visit review using our clinic review tool, if applicable. No additional management support is needed unless otherwise documented below in the visit note. 

## 2017-09-23 NOTE — Patient Instructions (Signed)
Linda Juarez , Thank you for taking time to come for your Medicare Wellness Visit. I appreciate your ongoing commitment to your health goals. Please review the following plan we discussed and let me know if I can assist you in the future.   These are the goals we discussed: Goals    . DIET - INCREASE WATER INTAKE     Starting 09/23/2017, I will continue to drink at least 4-6 glasses of water daily.        This is a list of the screening recommended for you and due dates:  Health Maintenance  Topic Date Due  . Tetanus Vaccine  12/24/2022  . Flu Shot  Completed  . DEXA scan (bone density measurement)  Completed  . Pneumonia vaccines  Completed   Preventive Care for Adults  A healthy lifestyle and preventive care can promote health and wellness. Preventive health guidelines for adults include the following key practices.  . A routine yearly physical is a good way to check with your health care provider about your health and preventive screening. It is a chance to share any concerns and updates on your health and to receive a thorough exam.  . Visit your dentist for a routine exam and preventive care every 6 months. Brush your teeth twice a day and floss once a day. Good oral hygiene prevents tooth decay and gum disease.  . The frequency of eye exams is based on your age, health, family medical history, use  of contact lenses, and other factors. Follow your health care provider's recommendations for frequency of eye exams.  . Eat a healthy diet. Foods like vegetables, fruits, whole grains, low-fat dairy products, and lean protein foods contain the nutrients you need without too many calories. Decrease your intake of foods high in solid fats, added sugars, and salt. Eat the right amount of calories for you. Get information about a proper diet from your health care provider, if necessary.  . Regular physical exercise is one of the most important things you can do for your health. Most adults  should get at least 150 minutes of moderate-intensity exercise (any activity that increases your heart rate and causes you to sweat) each week. In addition, most adults need muscle-strengthening exercises on 2 or more days a week.  Silver Sneakers may be a benefit available to you. To determine eligibility, you may visit the website: www.silversneakers.com or contact program at 430-680-9538 Mon-Fri between 8AM-8PM.   . Maintain a healthy weight. The body mass index (BMI) is a screening tool to identify possible weight problems. It provides an estimate of body fat based on height and weight. Your health care provider can find your BMI and can help you achieve or maintain a healthy weight.   For adults 20 years and older: ? A BMI below 18.5 is considered underweight. ? A BMI of 18.5 to 24.9 is normal. ? A BMI of 25 to 29.9 is considered overweight. ? A BMI of 30 and above is considered obese.   . Maintain normal blood lipids and cholesterol levels by exercising and minimizing your intake of saturated fat. Eat a balanced diet with plenty of fruit and vegetables. Blood tests for lipids and cholesterol should begin at age 54 and be repeated every 5 years. If your lipid or cholesterol levels are high, you are over 50, or you are at high risk for heart disease, you may need your cholesterol levels checked more frequently. Ongoing high lipid and cholesterol levels should be  treated with medicines if diet and exercise are not working.  . If you smoke, find out from your health care provider how to quit. If you do not use tobacco, please do not start.  . If you choose to drink alcohol, please do not consume more than 2 drinks per day. One drink is considered to be 12 ounces (355 mL) of beer, 5 ounces (148 mL) of wine, or 1.5 ounces (44 mL) of liquor.  . If you are 81-29 years old, ask your health care provider if you should take aspirin to prevent strokes.  . Use sunscreen. Apply sunscreen liberally and  repeatedly throughout the day. You should seek shade when your shadow is shorter than you. Protect yourself by wearing long sleeves, pants, a wide-brimmed hat, and sunglasses year round, whenever you are outdoors.  . Once a month, do a whole body skin exam, using a mirror to look at the skin on your back. Tell your health care provider of new moles, moles that have irregular borders, moles that are larger than a pencil eraser, or moles that have changed in shape or color.

## 2017-09-23 NOTE — Progress Notes (Signed)
PCP notes:   Health maintenance:  No gaps identified  Abnormal screenings:   Falls risk - hx of single fall without injury  Patient concerns:   None  Nurse concerns:  None  Next PCP appt:   10/01/17 @ 1045

## 2017-09-27 NOTE — Progress Notes (Signed)
I reviewed health advisor's note, was available for consultation, and agree with documentation and plan.  

## 2017-10-01 ENCOUNTER — Encounter: Payer: Self-pay | Admitting: Family Medicine

## 2017-10-01 ENCOUNTER — Ambulatory Visit (INDEPENDENT_AMBULATORY_CARE_PROVIDER_SITE_OTHER): Payer: Medicare Other | Admitting: Family Medicine

## 2017-10-01 VITALS — BP 110/80 | HR 63 | Temp 97.9°F | Ht 66.0 in | Wt 163.0 lb

## 2017-10-01 DIAGNOSIS — R3915 Urgency of urination: Secondary | ICD-10-CM

## 2017-10-01 DIAGNOSIS — M25551 Pain in right hip: Secondary | ICD-10-CM | POA: Diagnosis not present

## 2017-10-01 DIAGNOSIS — Z7189 Other specified counseling: Secondary | ICD-10-CM

## 2017-10-01 DIAGNOSIS — M898X1 Other specified disorders of bone, shoulder: Secondary | ICD-10-CM | POA: Diagnosis not present

## 2017-10-01 DIAGNOSIS — E785 Hyperlipidemia, unspecified: Secondary | ICD-10-CM

## 2017-10-01 DIAGNOSIS — Q740 Other congenital malformations of upper limb(s), including shoulder girdle: Secondary | ICD-10-CM

## 2017-10-01 DIAGNOSIS — Z0000 Encounter for general adult medical examination without abnormal findings: Secondary | ICD-10-CM

## 2017-10-01 DIAGNOSIS — M25552 Pain in left hip: Secondary | ICD-10-CM | POA: Diagnosis not present

## 2017-10-01 NOTE — Progress Notes (Signed)
Health maintenance d/w pt.  Mammogram pending.  DXA 2018, normal.   Colon CA screening not due.   Pap not due.  Vaccines up to date.   Advance directive d/w pt- nephew Jonn Shingles designated if patient were incapacitated.  She had filled out 5 wishes forms, scanned.   Diet and exercise d/w pt.  Diet is good, healthy.  Exercise is affected by her R hip pain.  Falls risk - hx of single fall without injury.  She fell in the snow when scraping her car and helping a neighbor.  Cautions d/w pt.  She is HOH, with hearing aids.     She has pending R hip surgery for OA.  She had post op anemia,  Resolved now.  D/w pt about her labs.  She still has some residual numbness in the L thigh since the surgery.  She "knows the deal" about hip surgery, having gone though it before.    She moved into a smaller apartment and that has been a good change for her.  D/w pt.  She is living alone and adjusting to the change.    She has baseline R medial clavicle changes and wanted that checked. No new sx but chronically prominent on the R side.    HLD d/w pt.  Labs d/w pt.  Diet and exercise d/w pt.  Not on meds currently.  No h/o DM2, HTN.    She has no urinary incontinence but some urgency at baseline. She has no burning with urination.  D/w pt about Kegel exercises in the meantime.    PMH and SH reviewed  ROS: Per HPI unless specifically indicated in ROS section   Meds, vitals, and allergies reviewed.   GEN: nad, alert and oriented HEENT: mucous membranes moist NECK: supple w/o LA, R medial clavicle deformity noted, no stridor CV: rrr PULM: ctab, no inc wob ABD: soft, +bs EXT: no edema SKIN: no acute rash Open comedone noted in the R lower abd/inguinal area.  Not ttp

## 2017-10-01 NOTE — Patient Instructions (Signed)
Don't change your meds for now.  Use a warm compress on the "blackhead" and it should gradually loosen up so you can gently express it. Keep it clean in the meantime.  Take care.  Glad to see you.

## 2017-10-02 DIAGNOSIS — Q740 Other congenital malformations of upper limb(s), including shoulder girdle: Secondary | ICD-10-CM | POA: Insufficient documentation

## 2017-10-02 DIAGNOSIS — R3915 Urgency of urination: Secondary | ICD-10-CM | POA: Insufficient documentation

## 2017-10-02 NOTE — Assessment & Plan Note (Signed)
Labs d/w pt.  Diet and exercise d/w pt.  Not on meds currently.  No h/o DM2, HTN.   Given her improvement in TG, wouldn't start a new med at this point.

## 2017-10-02 NOTE — Assessment & Plan Note (Signed)
She has no urinary incontinence but some urgency at baseline. She has no burning with urination.  D/w pt about Kegel exercises in the meantime.  Update me as needed.  She agrees.  >25 minutes spent in face to face time with patient, >50% spent in counselling or coordination of care, discussing incontinence, HLD, prev anemia, hip pain, etc.

## 2017-10-02 NOTE — Assessment & Plan Note (Signed)
Mammogram pending.  DXA 2018, normal.   Colon CA screening not due.   Pap not due.  Vaccines up to date.   Advance directive d/w pt- nephew Jonn Shingles designated if patient were incapacitated.  She had filled out 5 wishes forms, scanned prev.  Diet and exercise d/w pt.  Diet is good, healthy.  Exercise is affected by her R hip pain.  Falls risk - hx of single fall without injury.  She fell in the snow when scraping her car and helping a neighbor.  Cautions d/w pt.  She is HOH, with hearing aids.

## 2017-10-02 NOTE — Assessment & Plan Note (Signed)
Advance directive d/w pt- nephew John Faucette designated if patient were incapacitated. She had filled out 5 wishes forms, scanned prev.  

## 2017-10-02 NOTE — Assessment & Plan Note (Signed)
She has pending R hip surgery for OA.  She had post op anemia, resolved now, this was expected.  D/w pt about her labs.  She still has some residual numbness in the L thigh since the surgery.  She "knows the deal" about hip surgery, having gone though it before.  She has routine ortho f/u pending.

## 2017-10-02 NOTE — Assessment & Plan Note (Signed)
Chronically noted, no apparent compressive sx, would follow clinically.

## 2017-11-17 ENCOUNTER — Other Ambulatory Visit: Payer: Self-pay

## 2017-11-17 ENCOUNTER — Encounter
Admission: RE | Admit: 2017-11-17 | Discharge: 2017-11-17 | Disposition: A | Discharge status: Home / Self Care | Payer: Medicare Other | Source: Ambulatory Visit | Attending: Orthopedic Surgery | Admitting: Orthopedic Surgery

## 2017-11-17 DIAGNOSIS — Z01810 Encounter for preprocedural cardiovascular examination: Secondary | ICD-10-CM | POA: Insufficient documentation

## 2017-11-17 DIAGNOSIS — E785 Hyperlipidemia, unspecified: Secondary | ICD-10-CM | POA: Insufficient documentation

## 2017-11-17 DIAGNOSIS — R001 Bradycardia, unspecified: Secondary | ICD-10-CM | POA: Insufficient documentation

## 2017-11-17 DIAGNOSIS — Z01812 Encounter for preprocedural laboratory examination: Secondary | ICD-10-CM | POA: Insufficient documentation

## 2017-11-17 LAB — URINALYSIS, COMPLETE (UACMP) WITH MICROSCOPIC
BACTERIA UA: NONE SEEN
Bilirubin Urine: NEGATIVE
Glucose, UA: NEGATIVE mg/dL
Hgb urine dipstick: NEGATIVE
Ketones, ur: NEGATIVE mg/dL
Nitrite: NEGATIVE
Protein, ur: NEGATIVE mg/dL
SPECIFIC GRAVITY, URINE: 1.013 (ref 1.005–1.030)
pH: 5 (ref 5.0–8.0)

## 2017-11-17 LAB — CBC
HEMATOCRIT: 41.8 % (ref 35.0–47.0)
Hemoglobin: 13.8 g/dL (ref 12.0–16.0)
MCH: 29.8 pg (ref 26.0–34.0)
MCHC: 33 g/dL (ref 32.0–36.0)
MCV: 90.4 fL (ref 80.0–100.0)
PLATELETS: 203 10*3/uL (ref 150–440)
RBC: 4.63 MIL/uL (ref 3.80–5.20)
RDW: 13.7 % (ref 11.5–14.5)
WBC: 5.4 10*3/uL (ref 3.6–11.0)

## 2017-11-17 LAB — TYPE AND SCREEN
ABO/RH(D): AB NEG
ANTIBODY SCREEN: NEGATIVE

## 2017-11-17 LAB — SEDIMENTATION RATE: Sed Rate: 25 mm/hr (ref 0–30)

## 2017-11-17 LAB — PROTIME-INR
INR: 0.97
PROTHROMBIN TIME: 12.8 s (ref 11.4–15.2)

## 2017-11-17 LAB — SURGICAL PCR SCREEN
MRSA, PCR: NEGATIVE
Staphylococcus aureus: NEGATIVE

## 2017-11-17 LAB — BASIC METABOLIC PANEL
Anion gap: 10 (ref 5–15)
BUN: 19 mg/dL (ref 6–20)
CALCIUM: 9 mg/dL (ref 8.9–10.3)
CO2: 24 mmol/L (ref 22–32)
CREATININE: 0.83 mg/dL (ref 0.44–1.00)
Chloride: 107 mmol/L (ref 101–111)
GFR calc non Af Amer: >60 mL/min (ref >60)
Glucose, Bld: 92 mg/dL (ref 65–99)
Potassium: 3.8 mmol/L (ref 3.5–5.1)
Sodium: 141 mmol/L (ref 135–145)

## 2017-11-17 LAB — APTT: aPTT: 34 seconds (ref 24–36)

## 2017-11-17 NOTE — Patient Instructions (Signed)
Your procedure is scheduled on: 11/24/17 Tues Report to Same Day Surgery 2nd floor medical mall [facility] Entrance-take elevator on left to 2nd floor.  Check in with surgery information desk.) To find out your arrival time please call [phone] between 1PM - 3PM on 11/23/17 Mon  Remember: Instructions that are not followed completely may result in serious medical risk, up to and including death, or upon the discretion of your surgeon and anesthesiologist your surgery may need to be rescheduled.    _x___ 1. Do not eat food after midnight the night before your procedure. You may drink clear liquids up to 2 hours before you are scheduled to arrive at the hospital for your procedure.  Do not drink clear liquids within 2 hours of your scheduled arrival to the hospital.  Clear liquids include  --Water or Apple juice without pulp  --Clear carbohydrate beverage such as ClearFast or Gatorade  --Black Coffee or Clear Tea (No milk, no creamers, do not add anything to                  the coffee or Tea Type 1 and type 2 diabetics should only drink water.  No gum chewing or hard candies.     __x__ 2. No Alcohol for 24 hours before or after surgery.   __x__3. No Smoking or e-cigarettes for 24 prior to surgery.  Do not use any chewable tobacco products for at least 6 hour prior to surgery   ____  4. Bring all medications with you on the day of surgery if instructed.    __x__ 5. Notify your doctor if there is any change in your medical condition     (cold, fever, infections).    x___6. On the morning of surgery brush your teeth with toothpaste and water.  You may rinse your mouth with mouth wash if you wish.  Do not swallow any toothpaste or mouthwash.   Do not wear jewelry, make-up, hairpins, clips or nail polish.  Do not wear lotions, powders, or perfumes. You may wear deodorant.  Do not shave 48 hours prior to surgery. Men may shave face and neck.  Do not bring valuables to the hospital.     [facility] is not responsible for any belongings or valuables.               Contacts, dentures or bridgework may not be worn into surgery.  Leave your suitcase in the car. After surgery it may be brought to your room.  For patients admitted to the hospital, discharge time is determined by your                       treatment team.  _  Patients discharged the day of surgery will not be allowed to drive home.  You will need someone to drive you home and stay with you the night of your procedure.    Please read over the following fact sheets that you were given:   [facility] Preparing for Surgery and or MRSA Information   _x___ Take anti-hypertensive listed below, cardiac, seizure, asthma,     anti-reflux and psychiatric medicines. These include:  1.   2.  3.  4.  5.  6.  ____Fleets enema or Magnesium Citrate as directed.   _x___ Use CHG Soap or sage wipes as directed on instruction sheet   ____ Use inhalers on the day of surgery and bring to hospital day of surgery  ____ Stop Metformin and Janumet 2 days prior to surgery.    ____ Take 1/2 of usual insulin dose the night before surgery and none on the morning     surgery.   _x___ Follow recommendations from Cardiologist, Pulmonologist or PCP regarding          stopping Aspirin, Coumadin, Plavix ,Eliquis, Effient, or Pradaxa, and Pletal.  X____Stop Anti-inflammatories such as Advil, Aleve, Ibuprofen, Motrin, Naproxen, Naprosyn, Goodies powders or aspirin products. OK to take Tylenol and                          Celebrex.   _x___ Stop supplements until after surgery.  But may continue Vitamin D, Vitamin B,       and multivitamin.   ____ Bring C-Pap to the hospital.

## 2017-11-18 LAB — URINE CULTURE

## 2017-11-23 MED ORDER — CEFAZOLIN SODIUM-DEXTROSE 2-4 GM/100ML-% IV SOLN
2.0000 g | Freq: Once | INTRAVENOUS | Status: AC
  Administered 2017-11-24: 2 g via INTRAVENOUS

## 2017-11-23 MED ORDER — TRANEXAMIC ACID 1000 MG/10ML IV SOLN
1000.0000 mg | INTRAVENOUS | Status: AC
  Administered 2017-11-24: 1000 mg via INTRAVENOUS
  Filled 2017-11-23: qty 10

## 2017-11-24 ENCOUNTER — Other Ambulatory Visit: Payer: Self-pay

## 2017-11-24 ENCOUNTER — Inpatient Hospital Stay: Payer: Medicare Other | Admitting: Anesthesiology

## 2017-11-24 ENCOUNTER — Inpatient Hospital Stay: Payer: Medicare Other

## 2017-11-24 ENCOUNTER — Inpatient Hospital Stay
Admission: RE | Admit: 2017-11-24 | Discharge: 2017-11-26 | DRG: 470 | Disposition: A | Discharge status: Skilled Nursing Facility | Payer: Medicare Other | Source: Ambulatory Visit | Attending: Orthopedic Surgery | Admitting: Orthopedic Surgery

## 2017-11-24 ENCOUNTER — Encounter
Admission: RE | Disposition: A | Discharge status: Skilled Nursing Facility | Payer: Self-pay | Source: Ambulatory Visit | Attending: Orthopedic Surgery

## 2017-11-24 ENCOUNTER — Encounter: Payer: Self-pay | Admitting: Emergency Medicine

## 2017-11-24 DIAGNOSIS — H9190 Unspecified hearing loss, unspecified ear: Secondary | ICD-10-CM | POA: Diagnosis present

## 2017-11-24 DIAGNOSIS — Z79899 Other long term (current) drug therapy: Secondary | ICD-10-CM | POA: Diagnosis not present

## 2017-11-24 DIAGNOSIS — E785 Hyperlipidemia, unspecified: Secondary | ICD-10-CM | POA: Diagnosis present

## 2017-11-24 DIAGNOSIS — Z8261 Family history of arthritis: Secondary | ICD-10-CM | POA: Diagnosis not present

## 2017-11-24 DIAGNOSIS — K219 Gastro-esophageal reflux disease without esophagitis: Secondary | ICD-10-CM | POA: Diagnosis present

## 2017-11-24 DIAGNOSIS — M1611 Unilateral primary osteoarthritis, right hip: Secondary | ICD-10-CM | POA: Diagnosis present

## 2017-11-24 DIAGNOSIS — Z836 Family history of other diseases of the respiratory system: Secondary | ICD-10-CM | POA: Diagnosis not present

## 2017-11-24 DIAGNOSIS — Z96642 Presence of left artificial hip joint: Secondary | ICD-10-CM | POA: Diagnosis present

## 2017-11-24 DIAGNOSIS — Z8249 Family history of ischemic heart disease and other diseases of the circulatory system: Secondary | ICD-10-CM

## 2017-11-24 DIAGNOSIS — Z419 Encounter for procedure for purposes other than remedying health state, unspecified: Secondary | ICD-10-CM

## 2017-11-24 DIAGNOSIS — G8918 Other acute postprocedural pain: Secondary | ICD-10-CM

## 2017-11-24 DIAGNOSIS — Z87891 Personal history of nicotine dependence: Secondary | ICD-10-CM | POA: Diagnosis not present

## 2017-11-24 DIAGNOSIS — M25551 Pain in right hip: Secondary | ICD-10-CM | POA: Diagnosis present

## 2017-11-24 HISTORY — PX: TOTAL HIP ARTHROPLASTY: SHX124

## 2017-11-24 LAB — CBC
HEMATOCRIT: 39.2 % (ref 35.0–47.0)
HEMOGLOBIN: 13.1 g/dL (ref 12.0–16.0)
MCH: 29.8 pg (ref 26.0–34.0)
MCHC: 33.3 g/dL (ref 32.0–36.0)
MCV: 89.4 fL (ref 80.0–100.0)
Platelets: 200 10*3/uL (ref 150–440)
RBC: 4.39 MIL/uL (ref 3.80–5.20)
RDW: 13.5 % (ref 11.5–14.5)
WBC: 12.3 10*3/uL — ABNORMAL HIGH (ref 3.6–11.0)

## 2017-11-24 LAB — CREATININE, SERUM
Creatinine, Ser: 0.81 mg/dL (ref 0.44–1.00)
GFR calc Af Amer: >60 mL/min (ref >60)
GFR calc non Af Amer: >60 mL/min (ref >60)

## 2017-11-24 SURGERY — ARTHROPLASTY, HIP, TOTAL, ANTERIOR APPROACH
Anesthesia: Spinal | Laterality: Right | Wound class: Clean

## 2017-11-24 MED ORDER — NEOMYCIN-POLYMYXIN B GU 40-200000 IR SOLN
Status: AC
  Filled 2017-11-24: qty 20

## 2017-11-24 MED ORDER — ONDANSETRON HCL 4 MG/2ML IJ SOLN: 4.0000 mg | Freq: Once | INTRAMUSCULAR | Status: DC | PRN

## 2017-11-24 MED ORDER — PROPOFOL 10 MG/ML IV BOLUS
INTRAVENOUS | Status: DC | PRN
  Administered 2017-11-24: 20 mg via INTRAVENOUS

## 2017-11-24 MED ORDER — METHOCARBAMOL 500 MG PO TABS
500.0000 mg | ORAL_TABLET | Freq: Four times a day (QID) | ORAL | Status: DC | PRN
  Administered 2017-11-24: 500 mg via ORAL
  Filled 2017-11-24: qty 1

## 2017-11-24 MED ORDER — FENTANYL CITRATE (PF) 100 MCG/2ML IJ SOLN
INTRAMUSCULAR | Status: AC
  Filled 2017-11-24: qty 2

## 2017-11-24 MED ORDER — PHENYLEPHRINE HCL 10 MG/ML IJ SOLN
INTRAMUSCULAR | Status: AC
  Filled 2017-11-24: qty 1

## 2017-11-24 MED ORDER — ONDANSETRON HCL 4 MG PO TABS: 4.0000 mg | ORAL_TABLET | Freq: Four times a day (QID) | ORAL | Status: DC | PRN

## 2017-11-24 MED ORDER — EPHEDRINE SULFATE 50 MG/ML IJ SOLN
INTRAMUSCULAR | Status: DC | PRN
  Administered 2017-11-24: 10 mg via INTRAVENOUS

## 2017-11-24 MED ORDER — BUPIVACAINE HCL (PF) 0.5 % IJ SOLN
INTRAMUSCULAR | Status: DC | PRN
  Administered 2017-11-24: 3 mL via INTRATHECAL

## 2017-11-24 MED ORDER — BUPIVACAINE HCL (PF) 0.5 % IJ SOLN
INTRAMUSCULAR | Status: AC
  Filled 2017-11-24: qty 10

## 2017-11-24 MED ORDER — ALUM & MAG HYDROXIDE-SIMETH 200-200-20 MG/5ML PO SUSP: 30.0000 mL | ORAL | Status: DC | PRN

## 2017-11-24 MED ORDER — ACETAMINOPHEN 500 MG PO TABS
500.0000 mg | ORAL_TABLET | Freq: Four times a day (QID) | ORAL | Status: AC
  Administered 2017-11-24 – 2017-11-25 (×4): 500 mg via ORAL
  Filled 2017-11-24 (×4): qty 1

## 2017-11-24 MED ORDER — HYDROCODONE-ACETAMINOPHEN 7.5-325 MG PO TABS
1.0000 | ORAL_TABLET | ORAL | Status: DC | PRN
  Administered 2017-11-24 – 2017-11-25 (×2): 1 via ORAL
  Filled 2017-11-24: qty 1
  Filled 2017-11-24: qty 2

## 2017-11-24 MED ORDER — ADULT MULTIVITAMIN W/MINERALS CH
1.0000 | ORAL_TABLET | Freq: Every day | ORAL | Status: DC
  Administered 2017-11-24 – 2017-11-26 (×3): 1 via ORAL
  Filled 2017-11-24 (×3): qty 1

## 2017-11-24 MED ORDER — MAGNESIUM CITRATE PO SOLN
1.0000 | Freq: Once | ORAL | Status: DC | PRN
  Filled 2017-11-24: qty 296

## 2017-11-24 MED ORDER — PHENOL 1.4 % MT LIQD
1.0000 | OROMUCOSAL | Status: DC | PRN
  Filled 2017-11-24: qty 177

## 2017-11-24 MED ORDER — ZOLPIDEM TARTRATE 5 MG PO TABS: 5.0000 mg | ORAL_TABLET | Freq: Every evening | ORAL | Status: DC | PRN

## 2017-11-24 MED ORDER — METOCLOPRAMIDE HCL 10 MG PO TABS: 5.0000 mg | ORAL_TABLET | Freq: Three times a day (TID) | ORAL | Status: DC | PRN

## 2017-11-24 MED ORDER — MENTHOL 3 MG MT LOZG
1.0000 | LOZENGE | OROMUCOSAL | Status: DC | PRN
  Filled 2017-11-24: qty 9

## 2017-11-24 MED ORDER — LIDOCAINE HCL (PF) 2 % IJ SOLN
INTRAMUSCULAR | Status: AC
  Filled 2017-11-24: qty 10

## 2017-11-24 MED ORDER — ACETAMINOPHEN 325 MG PO TABS: 325.0000 mg | ORAL_TABLET | Freq: Four times a day (QID) | ORAL | Status: DC | PRN

## 2017-11-24 MED ORDER — BUPIVACAINE-EPINEPHRINE (PF) 0.25% -1:200000 IJ SOLN
INTRAMUSCULAR | Status: AC
  Filled 2017-11-24: qty 30

## 2017-11-24 MED ORDER — MORPHINE SULFATE (PF) 2 MG/ML IV SOLN
0.5000 mg | INTRAVENOUS | Status: DC | PRN
  Administered 2017-11-24: 0.5 mg via INTRAVENOUS
  Filled 2017-11-24: qty 1

## 2017-11-24 MED ORDER — FAMOTIDINE 20 MG PO TABS
ORAL_TABLET | ORAL | Status: AC
  Administered 2017-11-24: 20 mg via ORAL
  Filled 2017-11-24: qty 1

## 2017-11-24 MED ORDER — BISACODYL 10 MG RE SUPP
10.0000 mg | Freq: Every day | RECTAL | Status: DC | PRN
Start: 2017-11-24 — End: 2017-11-26
  Administered 2017-11-26: 10 mg via RECTAL
  Filled 2017-11-24: qty 1

## 2017-11-24 MED ORDER — DIPHENHYDRAMINE HCL 12.5 MG/5ML PO ELIX: 12.5000 mg | ORAL_SOLUTION | ORAL | Status: DC | PRN

## 2017-11-24 MED ORDER — ENOXAPARIN SODIUM 40 MG/0.4ML ~~LOC~~ SOLN
40.0000 mg | SUBCUTANEOUS | Status: DC
  Administered 2017-11-25 – 2017-11-26 (×2): 40 mg via SUBCUTANEOUS
  Filled 2017-11-24 (×2): qty 0.4

## 2017-11-24 MED ORDER — CLONIDINE HCL 0.1 MG PO TABS
0.1000 mg | ORAL_TABLET | Freq: Two times a day (BID) | ORAL | Status: DC
  Administered 2017-11-24 – 2017-11-25 (×2): 0.1 mg via ORAL
  Filled 2017-11-24 (×4): qty 1

## 2017-11-24 MED ORDER — ONDANSETRON HCL 4 MG/2ML IJ SOLN
4.0000 mg | Freq: Four times a day (QID) | INTRAMUSCULAR | Status: DC | PRN
Start: 2017-11-24 — End: 2017-11-26
  Administered 2017-11-24: 4 mg via INTRAVENOUS
  Filled 2017-11-24: qty 2

## 2017-11-24 MED ORDER — GLYCOPYRROLATE 0.2 MG/ML IJ SOLN
INTRAMUSCULAR | Status: AC
  Filled 2017-11-24: qty 1

## 2017-11-24 MED ORDER — CEFAZOLIN SODIUM-DEXTROSE 2-4 GM/100ML-% IV SOLN
INTRAVENOUS | Status: AC
  Filled 2017-11-24: qty 100

## 2017-11-24 MED ORDER — FAMOTIDINE 20 MG PO TABS
20.0000 mg | ORAL_TABLET | Freq: Once | ORAL | Status: AC
  Administered 2017-11-24: 20 mg via ORAL

## 2017-11-24 MED ORDER — LIDOCAINE HCL (PF) 2 % IJ SOLN
INTRAMUSCULAR | Status: DC | PRN
  Administered 2017-11-24: 50 mg

## 2017-11-24 MED ORDER — NEOMYCIN-POLYMYXIN B GU 40-200000 IR SOLN
Status: DC | PRN
  Administered 2017-11-24: 4 mL

## 2017-11-24 MED ORDER — METOCLOPRAMIDE HCL 5 MG/ML IJ SOLN: 5.0000 mg | Freq: Three times a day (TID) | INTRAMUSCULAR | Status: DC | PRN

## 2017-11-24 MED ORDER — LACTATED RINGERS IV SOLN
INTRAVENOUS | Status: DC
  Administered 2017-11-24: 07:00:00 via INTRAVENOUS

## 2017-11-24 MED ORDER — EPHEDRINE SULFATE 50 MG/ML IJ SOLN
INTRAMUSCULAR | Status: AC
  Filled 2017-11-24: qty 1

## 2017-11-24 MED ORDER — PROPOFOL 10 MG/ML IV BOLUS
INTRAVENOUS | Status: AC
  Filled 2017-11-24: qty 20

## 2017-11-24 MED ORDER — SALINE SPRAY 0.65 % NA SOLN
1.0000 | Freq: Four times a day (QID) | NASAL | Status: DC | PRN
  Filled 2017-11-24: qty 44

## 2017-11-24 MED ORDER — DEXTROSE 5 % IV SOLN
500.0000 mg | Freq: Four times a day (QID) | INTRAVENOUS | Status: DC | PRN
  Filled 2017-11-24: qty 5

## 2017-11-24 MED ORDER — CEFAZOLIN SODIUM-DEXTROSE 2-4 GM/100ML-% IV SOLN
2.0000 g | Freq: Four times a day (QID) | INTRAVENOUS | Status: AC
  Administered 2017-11-24 (×2): 2 g via INTRAVENOUS
  Filled 2017-11-24 (×3): qty 100

## 2017-11-24 MED ORDER — ONDANSETRON HCL 4 MG/2ML IJ SOLN
INTRAMUSCULAR | Status: AC
Start: 2017-11-24 — End: 2017-11-24
  Filled 2017-11-24: qty 2

## 2017-11-24 MED ORDER — PROPOFOL 500 MG/50ML IV EMUL
INTRAVENOUS | Status: DC | PRN
  Administered 2017-11-24: 25 ug/kg/min via INTRAVENOUS

## 2017-11-24 MED ORDER — HYDROCODONE-ACETAMINOPHEN 5-325 MG PO TABS
1.0000 | ORAL_TABLET | ORAL | Status: DC | PRN
  Administered 2017-11-24 – 2017-11-26 (×2): 1 via ORAL
  Filled 2017-11-24 (×2): qty 1

## 2017-11-24 MED ORDER — BUPIVACAINE-EPINEPHRINE 0.25% -1:200000 IJ SOLN
INTRAMUSCULAR | Status: DC | PRN
  Administered 2017-11-24: 30 mL

## 2017-11-24 MED ORDER — SENNOSIDES-DOCUSATE SODIUM 8.6-50 MG PO TABS: 1.0000 | ORAL_TABLET | Freq: Every evening | ORAL | Status: DC | PRN

## 2017-11-24 MED ORDER — RISAQUAD PO CAPS
1.0000 | ORAL_CAPSULE | Freq: Every day | ORAL | Status: DC
  Administered 2017-11-24 – 2017-11-25 (×2): 1 via ORAL
  Filled 2017-11-24 (×2): qty 1

## 2017-11-24 MED ORDER — FENTANYL CITRATE (PF) 100 MCG/2ML IJ SOLN: 25.0000 ug | INTRAMUSCULAR | Status: DC | PRN

## 2017-11-24 MED ORDER — DOCUSATE SODIUM 100 MG PO CAPS
100.0000 mg | ORAL_CAPSULE | Freq: Two times a day (BID) | ORAL | Status: DC
  Administered 2017-11-24 – 2017-11-26 (×5): 100 mg via ORAL
  Filled 2017-11-24 (×5): qty 1

## 2017-11-24 MED ORDER — SODIUM CHLORIDE 0.9 % IV SOLN
INTRAVENOUS | Status: DC
  Administered 2017-11-24 – 2017-11-25 (×2): via INTRAVENOUS

## 2017-11-24 MED ORDER — PHENYLEPHRINE HCL 10 MG/ML IJ SOLN
INTRAMUSCULAR | Status: DC | PRN
  Administered 2017-11-24 (×4): 100 ug via INTRAVENOUS

## 2017-11-24 MED ORDER — GLYCOPYRROLATE 0.2 MG/ML IJ SOLN
INTRAMUSCULAR | Status: DC | PRN
  Administered 2017-11-24: 0.2 mg via INTRAVENOUS

## 2017-11-24 MED ORDER — ONDANSETRON HCL 4 MG/2ML IJ SOLN
INTRAMUSCULAR | Status: DC | PRN
  Administered 2017-11-24: 4 mg via INTRAVENOUS

## 2017-11-24 SURGICAL SUPPLY — 56 items
BLADE SAW SAG 18.5X105 (BLADE) ×3 IMPLANT
BNDG COHESIVE 6X5 TAN STRL LF (GAUZE/BANDAGES/DRESSINGS) ×9 IMPLANT
CANISTER SUCT 1200ML W/VALVE (MISCELLANEOUS) ×3 IMPLANT
CHLORAPREP W/TINT 26ML (MISCELLANEOUS) ×3 IMPLANT
DRAPE C-ARM XRAY 36X54 (DRAPES) ×3 IMPLANT
DRAPE INCISE IOBAN 66X60 STRL (DRAPES) IMPLANT
DRAPE POUCH INSTRU U-SHP 10X18 (DRAPES) ×3 IMPLANT
DRAPE SHEET LG 3/4 BI-LAMINATE (DRAPES) ×9 IMPLANT
DRAPE TABLE BACK 80X90 (DRAPES) ×3 IMPLANT
DRESSING SURGICEL FIBRLLR 1X2 (HEMOSTASIS) ×2 IMPLANT
DRSG OPSITE POSTOP 4X8 (GAUZE/BANDAGES/DRESSINGS) ×6 IMPLANT
DRSG SURGICEL FIBRILLAR 1X2 (HEMOSTASIS) ×6
ELECT BLADE 6.5 EXT (BLADE) ×3 IMPLANT
ELECT REM PT RETURN 9FT ADLT (ELECTROSURGICAL) ×3
ELECTRODE REM PT RTRN 9FT ADLT (ELECTROSURGICAL) ×1 IMPLANT
EVACUATOR 1/8 PVC DRAIN (DRAIN) IMPLANT
GLOVE BIOGEL PI IND STRL 9 (GLOVE) ×1 IMPLANT
GLOVE BIOGEL PI INDICATOR 9 (GLOVE) ×2
GLOVE SURG SYN 9.0  PF PI (GLOVE) ×4
GLOVE SURG SYN 9.0 PF PI (GLOVE) ×2 IMPLANT
GOWN SRG 2XL LVL 4 RGLN SLV (GOWNS) ×1 IMPLANT
GOWN STRL NON-REIN 2XL LVL4 (GOWNS) ×3
GOWN STRL REUS W/ TWL LRG LVL3 (GOWN DISPOSABLE) ×1 IMPLANT
GOWN STRL REUS W/TWL LRG LVL3 (GOWN DISPOSABLE) ×3
HIP DBL LINER 54X28 (Liner) ×2 IMPLANT
HIP FEM HD S 28 (Head) ×2 IMPLANT
HOLDER FOLEY CATH W/STRAP (MISCELLANEOUS) ×3 IMPLANT
HOOD PEEL AWAY FLYTE STAYCOOL (MISCELLANEOUS) ×3 IMPLANT
KIT PREVENA INCISION MGT 13 (CANNISTER) ×3 IMPLANT
MAT BLUE FLOOR 46X72 FLO (MISCELLANEOUS) ×3 IMPLANT
NDL SAFETY ECLIPSE 18X1.5 (NEEDLE) ×1 IMPLANT
NDL SPNL 18GX3.5 QUINCKE PK (NEEDLE) ×1 IMPLANT
NEEDLE HYPO 18GX1.5 SHARP (NEEDLE) ×3
NEEDLE SPNL 18GX3.5 QUINCKE PK (NEEDLE) ×3 IMPLANT
NS IRRIG 1000ML POUR BTL (IV SOLUTION) ×3 IMPLANT
PACK HIP COMPR (MISCELLANEOUS) ×3 IMPLANT
SHELL ACETABULAR SZ 54 DM (Shell) ×2 IMPLANT
SOL PREP PVP 2OZ (MISCELLANEOUS) ×3
SOLUTION PREP PVP 2OZ (MISCELLANEOUS) ×1 IMPLANT
SPONGE DRAIN TRACH 4X4 STRL 2S (GAUZE/BANDAGES/DRESSINGS) ×3 IMPLANT
STAPLER SKIN PROX 35W (STAPLE) ×3 IMPLANT
STEM FEMORAL SZ2 STD COLLARED (Stem) ×2 IMPLANT
STRAP SAFETY 5IN WIDE (MISCELLANEOUS) ×3 IMPLANT
SUT DVC 2 QUILL PDO  T11 36X36 (SUTURE) ×2
SUT DVC 2 QUILL PDO T11 36X36 (SUTURE) ×1 IMPLANT
SUT SILK 0 (SUTURE) ×3
SUT SILK 0 30XBRD TIE 6 (SUTURE) ×1 IMPLANT
SUT V-LOC 90 ABS DVC 3-0 CL (SUTURE) ×3 IMPLANT
SUT VIC AB 1 CT1 36 (SUTURE) ×3 IMPLANT
SYR 20CC LL (SYRINGE) ×3 IMPLANT
SYR 30ML LL (SYRINGE) ×3 IMPLANT
SYR BULB IRRIG 60ML STRL (SYRINGE) ×3 IMPLANT
TAPE MICROFOAM 4IN (TAPE) ×3 IMPLANT
TOWEL OR 17X26 4PK STRL BLUE (TOWEL DISPOSABLE) ×3 IMPLANT
TRAY FOLEY W/METER SILVER 16FR (SET/KITS/TRAYS/PACK) ×3 IMPLANT
WND VAC CANISTER 500ML (MISCELLANEOUS) ×3 IMPLANT

## 2017-11-24 NOTE — NC FL2 (Signed)
[place] LEVEL OF CARE SCREENING TOOL     IDENTIFICATION  Patient Name: Linda Juarez Birthdate: 1932-03-30 Sex: female Admission Date (Current Location): 11/24/2017  [place] and [place] Number:  Engineering geologist and Address:  [facility], [address] [place], [place] [address]      Provider Number: [id number]  Attending Physician Name and Address:  Hessie Knows, MD  Relative Name and Phone Number:       Current Level of Care: Hospital Recommended Level of Care: Advance Prior Approval Number:    Date Approved/Denied:   PASRR Number: [id number])  Discharge Plan: SNF    Current Diagnoses: Patient Active Problem List   Diagnosis Date Noted  . Primary localized osteoarthritis of right hip 11/24/2017  . Abnormal prominence of clavicle 10/02/2017  . Urinary urgency 10/02/2017  . Edema 01/14/2017  . Health care maintenance 09/29/2016  . Hip pain, bilateral 10/09/2015  . Medicare annual wellness visit, subsequent 01/28/2015  . Advance care planning 01/28/2015  . HLD (hyperlipidemia) 01/16/2015  . Diverticulitis of colon without hemorrhage 06/07/2014    Orientation RESPIRATION BLADDER Height & Weight     Self, Time, Situation, Place  Normal Continent Weight: 161 lb (73 kg) Height:  5\' 6"  (167.6 cm)  BEHAVIORAL SYMPTOMS/MOOD NEUROLOGICAL BOWEL NUTRITION STATUS      Continent Diet(Regular Diet. )  AMBULATORY STATUS COMMUNICATION OF NEEDS Skin   Extensive Assist Verbally Surgical wounds, Wound Vac(Incision: Right Hip. Provena wound vac. )                       Personal Care Assistance Level of Assistance  Bathing, Feeding, Dressing Bathing Assistance: Limited assistance Feeding assistance: Independent Dressing Assistance: Limited assistance     Functional Limitations Info  Sight, Hearing, Speech Sight Info: Adequate Hearing Info: Impaired Speech Info: Adequate    SPECIAL CARE FACTORS  FREQUENCY  PT (By licensed PT), OT (By licensed OT)     PT Frequency: (5) OT Frequency: (5)            Contractures      Additional Factors Info  Code Status, Allergies Code Status Info: (Full Code. ) Allergies Info: (No Known Allergies. )           Current Medications (11/24/2017):  This is the current hospital active medication list Current Facility-Administered Medications  Medication Dose Route Frequency Provider Last Rate Last Dose  . 0.9 %  sodium chloride infusion   Intravenous Continuous Hessie Knows, MD 75 mL/hr at 11/24/17 1334    . [START ON 11/25/2017] acetaminophen (TYLENOL) tablet 325-650 mg  325-650 mg Oral Q6H PRN Hessie Knows, MD      . acetaminophen (TYLENOL) tablet 500 mg  500 mg Oral Q6H Hessie Knows, MD   500 mg at 11/24/17 1236  . acidophilus (RISAQUAD) capsule 1 capsule  1 capsule Oral Q supper Hessie Knows, MD      . alum & mag hydroxide-simeth (MAALOX/MYLANTA) 200-200-20 MG/5ML suspension 30 mL  30 mL Oral Q4H PRN Hessie Knows, MD      . bisacodyl (DULCOLAX) suppository 10 mg  10 mg Rectal Daily PRN Hessie Knows, MD      . ceFAZolin (ANCEF) IVPB 2g/100 mL premix  2 g Intravenous Q6H Hessie Knows, MD   Stopped at 11/24/17 1420  . cloNIDine (CATAPRES) tablet 0.1 mg  0.1 mg Oral BID Hessie Knows, MD   Stopped at 11/24/17 1526  . diphenhydrAMINE (BENADRYL) 12.5  MG/5ML elixir 12.5-25 mg  12.5-25 mg Oral Q4H PRN Hessie Knows, MD      . docusate sodium (COLACE) capsule 100 mg  100 mg Oral BID Hessie Knows, MD   100 mg at 11/24/17 1236  . [START ON 11/25/2017] enoxaparin (LOVENOX) injection 40 mg  40 mg Subcutaneous Q24H Hessie Knows, MD      . HYDROcodone-acetaminophen [facility]) 7.5-325 MG per tablet 1-2 tablet  1-2 tablet Oral Q4H PRN Hessie Knows, MD      . HYDROcodone-acetaminophen (NORCO/VICODIN) 5-325 MG per tablet 1-2 tablet  1-2 tablet Oral Q4H PRN Hessie Knows, MD   1 tablet at 11/24/17 1236  . magnesium citrate solution 1 Bottle  1 Bottle Oral  Once PRN Hessie Knows, MD      . menthol-cetylpyridinium (CEPACOL) lozenge 3 mg  1 lozenge Oral PRN Hessie Knows, MD       Or  . phenol (CHLORASEPTIC) mouth spray 1 spray  1 spray Mouth/Throat PRN Hessie Knows, MD      . methocarbamol (ROBAXIN) tablet 500 mg  500 mg Oral Q6H PRN Hessie Knows, MD   500 mg at 11/24/17 1236   Or  . methocarbamol (ROBAXIN) 500 mg in dextrose 5 % 50 mL IVPB  500 mg Intravenous Q6H PRN Hessie Knows, MD      . metoCLOPramide (REGLAN) tablet 5-10 mg  5-10 mg Oral Q8H PRN Hessie Knows, MD       Or  . metoCLOPramide (REGLAN) injection 5-10 mg  5-10 mg Intravenous Q8H PRN Hessie Knows, MD      . morphine 2 MG/ML injection 0.5-1 mg  0.5-1 mg Intravenous Q2H PRN Hessie Knows, MD   0.5 mg at 11/24/17 1455  . multivitamin with minerals tablet 1 tablet  1 tablet Oral Daily Hessie Knows, MD   1 tablet at 11/24/17 1236  . ondansetron (ZOFRAN) tablet 4 mg  4 mg Oral Q6H PRN Hessie Knows, MD       Or  . ondansetron [facility]) injection 4 mg  4 mg Intravenous Q6H PRN Hessie Knows, MD   4 mg at 11/24/17 1237  . senna-docusate (Senokot-S) tablet 1 tablet  1 tablet Oral QHS PRN Hessie Knows, MD      . sodium chloride (OCEAN) 0.65 % nasal spray 1 spray  1 spray Each Nare QID PRN Hessie Knows, MD      . zolpidem (AMBIEN) tablet 5 mg  5 mg Oral QHS PRN Hessie Knows, MD         Discharge Medications: Please see discharge summary for a list of discharge medications.  Relevant Imaging Results:  Relevant Lab Results:   Additional Information (SSN: [id number])  Jillisa Harris, Veronia Beets, LCSW

## 2017-11-24 NOTE — Anesthesia Post-op Follow-up Note (Signed)
Anesthesia QCDR form completed.        

## 2017-11-24 NOTE — Evaluation (Signed)
Physical Therapy Evaluation Patient Details Name: Linda Juarez MRN: [id number] DOB: 09/15/1932 Today's Date: 11/24/2017   History of Present Illness  Pt is an 84 y.o. female s/p R THA anterior approach 11/24/17.  Pt seen for PT evaluation POD #0.  PMH includes L THA 2018.  Clinical Impression  Prior to hospital admission, pt was independent with functional mobility.  Pt lives alone in level entry apt; pt's niece plans to stay with pt at least one week upon discharge home.  Currently pt is mod assist supine to sit; min assist to stand with RW; and CGA to ambulate a few feet with RW bed to recliner.  Pain 5/10 R hip beginning of session and 2-3/10 R hip end of session.  Pt would benefit from skilled PT to address noted impairments and functional limitations (see below for any additional details).  Upon hospital discharge, recommend pt discharge to home with HHPT and support of family.    Follow Up Recommendations Home health PT    Equipment Recommendations  Rolling walker with 5" wheels;3in1 (PT)    Recommendations for Other Services       Precautions / Restrictions Precautions Precautions: Anterior Hip;Fall Precaution Booklet Issued: Yes (comment) Restrictions Weight Bearing Restrictions: Yes RLE Weight Bearing: Weight bearing as tolerated      Mobility  Bed Mobility Overal bed mobility: Needs Assistance Bed Mobility: Supine to Sit     Supine to sit: Mod assist;HOB elevated     General bed mobility comments: vc's for bridging to scoot hips to R side of bed; assist for R LE and trunk supine to sit; vc's for use of bed rail; increased effort and time to perform for pt (limited d/t R hip pain)  Transfers Overall transfer level: Needs assistance Equipment used: Rolling walker (2 wheeled) Transfers: Sit to/from Stand Sit to Stand: Min assist         General transfer comment: vc's to scoot to edge of bed, UE and LE placement, "nose over toes", and assist to initiate stand up  to RW  Ambulation/Gait Ambulation/Gait assistance: Min guard Ambulation Distance (Feet): 3 Feet(bed to recliner) Assistive device: Rolling walker (2 wheeled) Gait Pattern/deviations: Step-to pattern;Antalgic Gait velocity: decreased   General Gait Details: decreased stance time R LE; vc's to increase UE support through RW to offweight R LE  Stairs            Wheelchair Mobility    Modified Rankin (Stroke Patients Only)       Balance Overall balance assessment: Needs assistance Sitting-balance support: Single extremity supported;Feet supported Sitting balance-Leahy Scale: Poor Sitting balance - Comments: pt requiring at least single UE support for static sitting balance (complicated due to R hip pain in sitting) Postural control: Left lateral lean(d/t R hip pain) Standing balance support: Bilateral upper extremity supported Standing balance-Leahy Scale: Poor Standing balance comment: requires B UE support for static balance                             Pertinent Vitals/Pain Pain Assessment: 0-10 Pain Score: 3 (5/10 beginning of session; 2-3/10 end of session) Pain Location: R hip Pain Descriptors / Indicators: Sore;Tender;Grimacing;Guarding Pain Intervention(s): Limited activity within patient's tolerance;Monitored during session;Premedicated before session;Repositioned  Vitals (HR and O2 on room air) stable and WFL throughout treatment session.    Home Living Family/patient expects to be discharged to:: Private residence Living Arrangements: Alone Available Help at Discharge: Family(Niece plans to stay with pt  for about 1 week) Type of Home: Apartment(Independent Living) Home Access: Level entry     Home Layout: One level Home Equipment: Walker - 4 wheels;Toilet riser;Grab bars - tub/shower;Bedside commode;Walker - 2 wheels      Prior Function Level of Independence: Independent         Comments: Pt reports 1 slip/fall in December in snow.      Hand Dominance        Extremity/Trunk Assessment   Upper Extremity Assessment Upper Extremity Assessment: Overall WFL for tasks assessed    Lower Extremity Assessment Lower Extremity Assessment: RLE deficits/detail(L LE WFL) RLE Deficits / Details: R hip flexion at least 3-/5; R knee flexion/extension at least 3+/5; R DF at least 4/5 RLE: Unable to fully assess due to pain    Cervical / Trunk Assessment Cervical / Trunk Assessment: Normal  Communication   Communication: HOH  Cognition Arousal/Alertness: Awake/alert Behavior During Therapy: WFL for tasks assessed/performed Overall Cognitive Status: Within Functional Limits for tasks assessed                                        General Comments General comments (skin integrity, edema, etc.): R hip wound vac in place; foley catheter in place (nursing aware that it was leaking prior to therapy session).  Nursing cleared pt for participation in physical therapy.  Pt agreeable to PT session.  Pt's daughter present during session (pt's son stepped out of room for mobility part of session).    Exercises Total Joint Exercises Ankle Circles/Pumps: AROM;Strengthening;Both;10 reps;Supine Quad Sets: AROM;Strengthening;Both;10 reps;Supine Gluteal Sets: AROM;Strengthening;Both;10 reps;Supine Towel Squeeze: AROM;Strengthening;Both;10 reps;Supine(pillow between pt's knees) Short Arc Quad: AROM;Strengthening;Right;10 reps;Supine Heel Slides: AAROM;Strengthening;Right;10 reps;Supine Hip ABduction/ADduction: AAROM;Strengthening;Right;10 reps;Supine   Assessment/Plan    PT Assessment Patient needs continued PT services  PT Problem List Decreased strength;Decreased activity tolerance;Decreased balance;Decreased mobility;Decreased knowledge of use of DME;Decreased knowledge of precautions;Pain       PT Treatment Interventions DME instruction;Gait training;Functional mobility training;Therapeutic activities;Therapeutic  exercise;Balance training;Patient/family education    PT Goals (Current goals can be found in the Care Plan section)  Acute Rehab PT Goals Patient Stated Goal: to have less R hip pain PT Goal Formulation: With patient Time For Goal Achievement: 12/08/17 Potential to Achieve Goals: Good    Frequency BID   Barriers to discharge        Co-evaluation               AM-PAC PT "6 Clicks" Daily Activity  Outcome Measure Difficulty turning over in bed (including adjusting bedclothes, sheets and blankets)?: Unable Difficulty moving from lying on back to sitting on the side of the bed? : Unable Difficulty sitting down on and standing up from a chair with arms (e.g., wheelchair, bedside commode, etc,.)?: Unable Help needed moving to and from a bed to chair (including a wheelchair)?: A Little Help needed walking in hospital room?: A Little Help needed climbing 3-5 steps with a railing? : A Lot 6 Click Score: 11    End of Session Equipment Utilized During Treatment: Gait belt Activity Tolerance: Patient limited by pain Patient left: in chair;with call bell/phone within reach;with chair alarm set;with nursing/sitter in room(B heels elevated via pillow; nursing reported she would place SCD's on pt and set up pt's room when she was done with pt) Nurse Communication: Mobility status;Precautions;Weight bearing status;Other (comment)(Pt's pain status) PT Visit Diagnosis: Other abnormalities of gait  and mobility (R26.89);Muscle weakness (generalized) (M62.81);History of falling (Z91.81);Difficulty in walking, not elsewhere classified (R26.2);Pain Pain - Right/Left: Right Pain - part of body: Hip    Time: 9038-3338 PT Time Calculation (min) (ACUTE ONLY): 32 min   Charges:   PT Evaluation $PT Eval Low Complexity: 1 Low PT Treatments $Therapeutic Exercise: 8-22 mins   PT G CodesLeitha Bleak, PT 11/24/17, 5:07 PM [phone]

## 2017-11-24 NOTE — Anesthesia Procedure Notes (Signed)
Spinal  Patient location during procedure: OR Staffing Anesthesiologist: Gunnar Fusi, MD Resident/CRNA: Rolla Plate, CRNA Performed: resident/CRNA  Preanesthetic Checklist Completed: patient identified, site marked, surgical consent, pre-op evaluation, timeout performed, IV checked, risks and benefits discussed and monitors and equipment checked Spinal Block Patient position: sitting Prep: ChloraPrep and site prepped and draped Patient monitoring: heart rate, continuous pulse ox, blood pressure and cardiac monitor Approach: midline Location: L4-5 Injection technique: single-shot Needle Needle type: Introducer and Pencan  Needle gauge: 24 G Needle length: 9 cm Assessment Sensory level: T10 Additional Notes Negative paresthesia. Negative blood return. Positive free-flowing CSF. Expiration date of kit checked and confirmed. Patient tolerated procedure well, without complications.

## 2017-11-24 NOTE — Progress Notes (Signed)
Pt came out from OR with spinal and sensation level at T10. Pt now at sensation level at a L2 and able to move her knee back and forth. Dr. Ronelle Nigh stated pt OK to go to room 150 now. Kal Chait E 10:26 AM 11/24/2017

## 2017-11-24 NOTE — H&P (Signed)
Reviewed paper H+P, will be scanned into chart. No changes noted.  

## 2017-11-24 NOTE — Progress Notes (Addendum)
Took over care of pt from [place], [place]. Pt alert and resting in bed. Family at bedside. IV infusing NS. Pt on room air. Foot pumps on and running. No complaints from pt at this time.

## 2017-11-24 NOTE — Anesthesia Preprocedure Evaluation (Signed)
Anesthesia Evaluation  Patient identified by MRN, date of birth, ID band Patient awake    Reviewed: Allergy & Precautions, NPO status , Patient's Chart, lab work & pertinent test results  History of Anesthesia Complications (+) PONV and history of anesthetic complications  Airway Mallampati: II       Dental   Pulmonary neg sleep apnea, neg COPD, former smoker,           Cardiovascular (-) hypertension(-) Past MI and (-) CHF (-) dysrhythmias (-) Valvular Problems/Murmurs     Neuro/Psych neg Seizures    GI/Hepatic Neg liver ROS, neg GERD  ,  Endo/Other  neg diabetes  Renal/GU negative Renal ROS     Musculoskeletal   Abdominal   Peds  Hematology   Anesthesia Other Findings   Reproductive/Obstetrics                             Anesthesia Physical Anesthesia Plan  ASA: II  Anesthesia Plan: Spinal   Post-op Pain Management:    Induction:   PONV Risk Score and Plan: Propofol infusion and TIVA  Airway Management Planned: Nasal Cannula  Additional Equipment:   Intra-op Plan:   Post-operative Plan:   Informed Consent: I have reviewed the patients History and Physical, chart, labs and discussed the procedure including the risks, benefits and alternatives for the proposed anesthesia with the patient or authorized representative who has indicated his/her understanding and acceptance.     Plan Discussed with:   Anesthesia Plan Comments:         Anesthesia Quick Evaluation

## 2017-11-24 NOTE — Transfer of Care (Signed)
Immediate Anesthesia Transfer of Care Note  Patient: Linda Juarez  Procedure(s) Performed: TOTAL HIP ARTHROPLASTY ANTERIOR APPROACH (Right )  Patient Location: PACU  Anesthesia Type:General  Level of Consciousness: awake, alert  and oriented  Airway & Oxygen Therapy: Patient Spontanous Breathing  Post-op Assessment: Report given to RN and Post -op Vital signs reviewed and stable  Post vital signs: Reviewed  Last Vitals:  Vitals:   11/24/17 0628 11/24/17 0905  BP: (!) 169/85 112/65  Pulse: 62 76  Resp: 17 13  Temp: 36.4 C 36.7 C  SpO2: 98% 100%    Last Pain:  Vitals:   11/24/17 0628  TempSrc: Temporal  PainSc: 0-No pain         Complications: No apparent anesthesia complications

## 2017-11-24 NOTE — Op Note (Signed)
11/24/2017  9:14 AM  PATIENT:  Linda Juarez  84 y.o. female  PRE-OPERATIVE DIAGNOSIS:  PRIMARY LOCALIZED OSTEOARTHRITIS OF RIGHT HIP  POST-OPERATIVE DIAGNOSIS:  PRIMARY LOCALIZED OSTEOARTHRITIS OF RIGHT HIP  PROCEDURE:  Procedure(s): TOTAL HIP ARTHROPLASTY ANTERIOR APPROACH (Right)  SURGEON: Laurene Footman, MD  ASSISTANTS: None  ANESTHESIA:   spinal  EBL:  Total I/O In: 750 [I.V.:750] Out: 500 [Urine:300; Blood:200]  BLOOD ADMINISTERED:none  DRAINS: none   LOCAL MEDICATIONS USED:  MARCAINE     SPECIMEN:  Source of Specimen:  Right femoral head  DISPOSITION OF SPECIMEN:  PATHOLOGY  COUNTS:  YES  TOURNIQUET:  * No tourniquets in log *  IMPLANTS: Medacta AMIS 2 standard stem with 54 mm Mpact cup DM with liner and S 28 mm metal head  DICTATION: .Dragon Dictation   The patient was brought to the operating room and after spinal anesthesia was obtained patient was placed on the operative table with the ipsilateral foot into the Medacta attachment, contralateral leg on a well-padded table. C-arm was brought in and preop template x-ray taken. After prepping and draping in usual sterile fashion appropriate patient identification and timeout procedures were completed. Anterior approach to the hip was obtained and centered over the greater trochanter and TFL muscle. The subcutaneous tissue was incised hemostasis being achieved by electrocautery. TFL fascia was incised and the muscle retracted laterally deep retractor placed. The lateral femoral circumflex vessels were identified and ligated. The anterior capsule was exposed and a capsulotomy performed. The neck was identified and a femoral neck cut carried out with a saw. The head was removed without difficulty and showed sclerotic femoral head and acetabulum. Reaming was carried out to 52 mm and a 54 mm cup trial gave appropriate tightness to the acetabular component a 54 DM cup was impacted into position. The leg was then externally  rotated and ischiofemoral and pubofemoral releases carried out. The femur was sequentially broached to a size 2, size 2 standard with as had trials were placed and the final components chosen. The 2 standard stem was inserted along with a metal S 28 mm head and 54 mm liner. The hip was reduced and was stable the wound was thoroughly irrigated with fibrillar placed along the posterior capsulotomy and medial neck. The deep fascia was closed using a heavy Quill after infiltration of 30 cc of quarter percent Sensorcaine with epinephrine. 3-0 v-lc subcutaneous closure with staples and incisional wound VAC applied.  PLAN OF CARE: Admit to inpatient

## 2017-11-25 LAB — BASIC METABOLIC PANEL
Anion gap: 9 (ref 5–15)
BUN: 13 mg/dL (ref 6–20)
CALCIUM: 8.5 mg/dL — AB (ref 8.9–10.3)
CHLORIDE: 108 mmol/L (ref 101–111)
CO2: 23 mmol/L (ref 22–32)
CREATININE: 0.8 mg/dL (ref 0.44–1.00)
GFR calc non Af Amer: >60 mL/min (ref >60)
Glucose, Bld: 127 mg/dL — ABNORMAL HIGH (ref 65–99)
Potassium: 3.8 mmol/L (ref 3.5–5.1)
SODIUM: 140 mmol/L (ref 135–145)

## 2017-11-25 LAB — CBC
HEMATOCRIT: 33.1 % — AB (ref 35.0–47.0)
HEMOGLOBIN: 11.2 g/dL — AB (ref 12.0–16.0)
MCH: 30.2 pg (ref 26.0–34.0)
MCHC: 33.7 g/dL (ref 32.0–36.0)
MCV: 89.6 fL (ref 80.0–100.0)
Platelets: 178 10*3/uL (ref 150–440)
RBC: 3.7 MIL/uL — ABNORMAL LOW (ref 3.80–5.20)
RDW: 13.4 % (ref 11.5–14.5)
WBC: 7.9 10*3/uL (ref 3.6–11.0)

## 2017-11-25 NOTE — Progress Notes (Signed)
Physical Therapy Treatment Patient Details Name: Linda Juarez MRN: [id number] DOB: Oct 21, 1931 Today's Date: 11/25/2017    History of Present Illness Pt is an 84 y.o. female s/p R THA anterior approach 11/24/17.  Pt seen for PT evaluation POD #0.  PMH includes L THA 2018.    PT Comments    Pt appearing in more pain in R hip today with transfers (requiring increased time to move) and pt was unable to advance R LE on her own with ambulation (pt requiring min assist of therapist to advance R LE each step).  Pt grimacing in pain with movement of R hip but reporting 5/10 R hip pain end of session.  Will continue to focus on strengthening, balance, transfers, and progressive ambulation per pt tolerance.  D/t increased assist levels for ambulation and family reporting niece not available initially for assist for home discharge, recommend pt discharge to [facility] (SW notified).    Follow Up Recommendations  SNF     Equipment Recommendations  Rolling walker with 5" wheels;3in1 (PT)    Recommendations for Other Services       Precautions / Restrictions Precautions Precautions: Anterior Hip;Fall Precaution Booklet Issued: Yes (comment) Restrictions Weight Bearing Restrictions: Yes RLE Weight Bearing: Weight bearing as tolerated    Mobility  Bed Mobility Overal bed mobility: Needs Assistance             General bed mobility comments: Deferred (pt sitting up in chair beginning and end of session)  Transfers Overall transfer level: Needs assistance Equipment used: Rolling walker (2 wheeled) Transfers: Sit to/from Stand Sit to Stand: Min assist         General transfer comment: vc's to scoot to edge of bed, UE and LE placement, "nose over toes", and assist to initiate stand up to RW  Ambulation/Gait Ambulation/Gait assistance: Min assist Ambulation Distance (Feet): 25 Feet Assistive device: Rolling walker (2 wheeled) Gait Pattern/deviations: Step-to pattern;Antalgic Gait  velocity: decreased   General Gait Details: decreased stance time R LE; vc's to increase UE support through RW to offweight R LE; assist to advance R LE every step   Stairs            Wheelchair Mobility    Modified Rankin (Stroke Patients Only)       Balance Overall balance assessment: Needs assistance Sitting-balance support: Single extremity supported;Feet supported Sitting balance-Leahy Scale: Poor Sitting balance - Comments: pt requiring at least single UE support for static sitting balance (complicated due to R hip pain in sitting) Postural control: Left lateral lean(d/t R hip pain) Standing balance support: Bilateral upper extremity supported Standing balance-Leahy Scale: Poor Standing balance comment: requires B UE support for static balance                            Cognition Arousal/Alertness: Awake/alert Behavior During Therapy: WFL for tasks assessed/performed Overall Cognitive Status: Within Functional Limits for tasks assessed                                        Exercises Total Joint Exercises Long Arc Quad: AROM;Left;AAROM;Right;Strengthening;10 reps;Seated General Exercises - Lower Extremity Hip Flexion/Marching: AROM;Left;AAROM;Right;Strengthening;10 reps;Seated    General Comments General comments (skin integrity, edema, etc.): R hip wound vac in place.  Pt agreeable to PT session.  At beginning of session, pt and pt's family member verbalizing significant concerns  regarding PT's initial discharge recommendations of HHPT (family requesting STR): therapist educated pt and pt's family on clinical decision making behind initial recommendations.      Pertinent Vitals/Pain Pain Assessment: 0-10 Pain Score: 5  Pain Location: R hip Pain Descriptors / Indicators: Sore;Tender;Grimacing;Guarding Pain Intervention(s): Limited activity within patient's tolerance;Monitored during session;Premedicated before session;Repositioned   Vitals (HR and O2 on room air) stable and WFL throughout treatment session.    Home Living                      Prior Function            PT Goals (current goals can now be found in the care plan section) Acute Rehab PT Goals Patient Stated Goal: to have less R hip pain PT Goal Formulation: With patient Time For Goal Achievement: 12/08/17 Potential to Achieve Goals: Fair Progress towards PT goals: Progressing toward goals    Frequency    BID      PT Plan Discharge plan needs to be updated(SW notified)    Co-evaluation              AM-PAC PT "6 Clicks" Daily Activity  Outcome Measure  Difficulty turning over in bed (including adjusting bedclothes, sheets and blankets)?: Unable Difficulty moving from lying on back to sitting on the side of the bed? : Unable Difficulty sitting down on and standing up from a chair with arms (e.g., wheelchair, bedside commode, etc,.)?: Unable Help needed moving to and from a bed to chair (including a wheelchair)?: A Little Help needed walking in hospital room?: A Little Help needed climbing 3-5 steps with a railing? : A Lot 6 Click Score: 11    End of Session Equipment Utilized During Treatment: Gait belt Activity Tolerance: Patient limited by pain Patient left: in chair;with call bell/phone within reach;with chair alarm set;with nursing/sitter in room;with SCD's reapplied;with family/visitor present(B heels elevated via pillow) Nurse Communication: Mobility status;Precautions;Weight bearing status PT Visit Diagnosis: Other abnormalities of gait and mobility (R26.89);Muscle weakness (generalized) (M62.81);History of falling (Z91.81);Difficulty in walking, not elsewhere classified (R26.2);Pain Pain - Right/Left: Right Pain - part of body: Hip     Time: 1220-1258 PT Time Calculation (min) (ACUTE ONLY): 38 min  Charges:  $Gait Training: 8-22 mins $Therapeutic Exercise: 8-22 mins $Therapeutic Activity: 8-22 mins                     G CodesLeitha Bleak, PT 11/25/17, 1:38 PM [phone]

## 2017-11-25 NOTE — Care Management Note (Signed)
Case Management Note  Patient Details  Name: Linda Juarez MRN: [id number] Date of Birth: 1931-11-06  Subjective/Objective:   POD # 1 right THA. Met with patient. She lives alone but her niece will be coming to stay with her for a week after she is discharged. She has a walker, cane and elevated toilet seat. Offered home health choice. Referral to [facility] with [facility] [place]. Pharmacy: [facility]  [phone]. Called Lovenox 40 mg # 14 no refills per Dr. Rudene Christians.                 Action/Plan: [facility] for HHPT. No DME needs. Lovenox called in  Expected Discharge Date:  11/27/17               Expected Discharge Plan:  [place]  In-House Referral:     Discharge planning Services  CM Consult  Post Acute Care Choice:  Home Health Choice offered to:  Patient  DME Arranged:    DME Agency:     HH Arranged:  PT [place]:  [facility] [employer]  Status of Service:  In process, will continue to follow  If discussed at Long Length of Stay Meetings, dates discussed:    Additional Comments:  Jolly Mango, RN 11/25/2017, 10:22 AM

## 2017-11-25 NOTE — Progress Notes (Signed)
Physical Therapy Treatment Patient Details Name: Linda Juarez MRN: [id number] DOB: [birth year] Today's Date: 11/25/2017    History of Present Illness Pt is an 84 y.o. female s/p R THA anterior approach 11/24/17.  Pt seen for PT evaluation POD #0.  PMH includes L THA 2018.    PT Comments    Pt slowly progressing with ambulation distance to 30 feet with RW (assist required to advance R LE d/t R hip pain and in order to promote normal gait pattern).  Pt also requires increased effort, time, and assist to stand d/t R hip pain.  R hip pain 5/10 beginning of session but decreased to 2/10 end of session resting in bed.  Will continue to focus on strengthening, transfers, and progressive ambulation per pt tolerance.    Follow Up Recommendations  SNF     Equipment Recommendations  Rolling walker with 5" wheels;3in1 (PT)    Recommendations for Other Services       Precautions / Restrictions Precautions Precautions: Anterior Hip;Fall Precaution Booklet Issued: Yes (comment) Restrictions Weight Bearing Restrictions: Yes RLE Weight Bearing: Weight bearing as tolerated    Mobility  Bed Mobility Overal bed mobility: Needs Assistance Bed Mobility: Sit to Supine       Sit to supine: Mod assist   General bed mobility comments: assist for R LE and trunk; increased time to perform d/t R hip pain; vc's for technique  Transfers Overall transfer level: Needs assistance Equipment used: Rolling walker (2 wheeled) Transfers: Sit to/from Stand Sit to Stand: Min assist;Mod assist         General transfer comment: vc's to scoot to edge of chair, UE and LE placement, "nose over toes", and assist to initiate stand up to RW  Ambulation/Gait Ambulation/Gait assistance: Min assist Ambulation Distance (Feet): 30 Feet Assistive device: Rolling walker (2 wheeled) Gait Pattern/deviations: Step-to pattern;Antalgic Gait velocity: decreased   General Gait Details: decreased stance time R LE; vc's  to increase UE support through RW to offweight R LE; assist to advance R LE (pt able to eventually advance R LE last 5 feet towards bed but pt noted to be vaulting with L LE in order to advance R LE)   Stairs            Wheelchair Mobility    Modified Rankin (Stroke Patients Only)       Balance Overall balance assessment: Needs assistance Sitting-balance support: Single extremity supported;Feet supported Sitting balance-Leahy Scale: Poor Sitting balance - Comments: pt requiring at least single UE support for static sitting balance (complicated due to R hip pain in sitting) Postural control: Left lateral lean(d/t R hip pain) Standing balance support: Bilateral upper extremity supported Standing balance-Leahy Scale: Poor Standing balance comment: requires B UE support for static balance                            Cognition Arousal/Alertness: Awake/alert Behavior During Therapy: WFL for tasks assessed/performed Overall Cognitive Status: Within Functional Limits for tasks assessed                                        Exercises      General Comments General comments (skin integrity, edema, etc.): R hip wound vac in place      Pertinent Vitals/Pain Pain Assessment: 0-10 Pain Score: 2  Pain Location: R hip Pain Descriptors /  Indicators: Sore;Tender;Grimacing;Guarding Pain Intervention(s): Limited activity within patient's tolerance;Monitored during session;Premedicated before session;Repositioned    Home Living                      Prior Function            PT Goals (current goals can now be found in the care plan section) Acute Rehab PT Goals Patient Stated Goal: to have less R hip pain PT Goal Formulation: With patient Time For Goal Achievement: 12/08/17 Potential to Achieve Goals: Fair Progress towards PT goals: Progressing toward goals    Frequency    BID      PT Plan Current plan remains appropriate     Co-evaluation              AM-PAC PT "6 Clicks" Daily Activity  Outcome Measure  Difficulty turning over in bed (including adjusting bedclothes, sheets and blankets)?: Unable Difficulty moving from lying on back to sitting on the side of the bed? : Unable Difficulty sitting down on and standing up from a chair with arms (e.g., wheelchair, bedside commode, etc,.)?: Unable Help needed moving to and from a bed to chair (including a wheelchair)?: A Little Help needed walking in hospital room?: A Little Help needed climbing 3-5 steps with a railing? : A Lot 6 Click Score: 11    End of Session Equipment Utilized During Treatment: Gait belt Activity Tolerance: Patient limited by pain Patient left: in bed;with call bell/phone within reach;with bed alarm set;with SCD's reapplied(B heels elevated via pillow) Nurse Communication: Mobility status;Precautions;Weight bearing status PT Visit Diagnosis: Other abnormalities of gait and mobility (R26.89);Muscle weakness (generalized) (M62.81);History of falling (Z91.81);Difficulty in walking, not elsewhere classified (R26.2);Pain Pain - Right/Left: Right Pain - part of body: Hip     Time: 1610-9604 PT Time Calculation (min) (ACUTE ONLY): 30 min  Charges:  $Gait Training: 8-22 mins $Therapeutic Activity: 8-22 mins                    G CodesLeitha Bleak, PT 11/25/17, 6:19 PM [phone]

## 2017-11-25 NOTE — Progress Notes (Signed)
   Subjective: 1 Day Post-Op Procedure(s) (LRB): TOTAL HIP ARTHROPLASTY ANTERIOR APPROACH (Right) Patient reports pain as mild.   Patient is well, and has had no acute complaints or problems Denies any CP, SOB, ABD pain. We will continue therapy today.  Plan is to go Skilled nursing facility after hospital stay.  Objective: Vital signs in last 24 hours: Temp:  [97.4 F (36.3 C)-98.4 F (36.9 C)] 97.7 F (36.5 C) (03/06 0758) Pulse Rate:  [59-78] 63 (03/06 0758) Resp:  [12-20] 16 (03/06 0758) BP: (103-188)/(55-103) 108/55 (03/06 0758) SpO2:  [96 %-100 %] 96 % (03/06 0758)  Intake/Output from previous day: 03/05 0701 - 03/06 0700 In: 1847.5 [P.O.:840; I.V.:907.5; IV Piggyback:100] Out: 3000 [Urine:2800; Blood:200] Intake/Output this shift: No intake/output data recorded.  Recent Labs    11/24/17 1133 11/25/17 0348  HGB 13.1 11.2*   Recent Labs    11/24/17 1133 11/25/17 0348  WBC 12.3* 7.9  RBC 4.39 3.70*  HCT 39.2 33.1*  PLT 200 178   Recent Labs    11/24/17 1133 11/25/17 0348  NA  --  140  K  --  3.8  CL  --  108  CO2  --  23  BUN  --  13  CREATININE 0.81 0.80  GLUCOSE  --  127*  CALCIUM  --  8.5*   No results for input(s): LABPT, INR in the last 72 hours.  EXAM General - Patient is Alert, Appropriate and Oriented Extremity - Neurovascular intact Sensation intact distally Intact pulses distally Dorsiflexion/Plantar flexion intact No cellulitis present Compartment soft Dressing - dressing C/D/I and no drainage. Wound vac intact with out drainage Motor Function - intact, moving foot and toes well on exam.   Past Medical History:  Diagnosis Date  . 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 Arthritis    Osteoarthritis  . Diverticulitis   . Diverticulosis of colon without hemorrhage   . GERD (gastroesophageal reflux disease)   . HOH (hard of hearing)   . Hyperlipidemia      Assessment/Plan:   1 Day Post-Op Procedure(s) (LRB): TOTAL HIP ARTHROPLASTY ANTERIOR APPROACH (Right) Active Problems:   Primary localized osteoarthritis of right hip  Estimated body mass index is 25.99 kg/m as calculated from the following:   Height as of this encounter: 5\' 6"  (1.676 m).   Weight as of this encounter: 73 kg (161 lb). Advance diet Up with therapy  Needs BM Recheck labs in the am CM to assist with discharge to SNF    DVT Prophylaxis - Lovenox, Foot Pumps and TED hose Weight-Bearing as tolerated to right leg   T. Rachelle Hora, PA-C [place] 11/25/2017, 8:20 AM

## 2017-11-25 NOTE — Clinical Social Work Placement (Signed)
   CLINICAL SOCIAL WORK PLACEMENT  NOTE  Date:  11/25/2017  Patient Details  Name: [name] MRN: [id number] Date of Birth: [birth year]  Clinical Social Work is seeking post-discharge placement for this patient at the [place] level of care (*CSW will initial, date and re-position this form in  chart as items are completed):  Yes   Patient/family provided with [facility] Work Department's list of facilities offering this level of care within the geographic area requested by the patient (or if unable, by the patient's family).  Yes   Patient/family informed of their freedom to choose among providers that offer the needed level of care, that participate in Medicare, Medicaid or managed care program needed by the patient, have an available bed and are willing to accept the patient.  Yes   Patient/family informed of [facility]'s ownership interest in [facility] and [facility], as well as of the fact that they are under no obligation to receive care at these facilities.  PASRR submitted to EDS on       PASRR number received on       Existing PASRR number confirmed on 11/24/17     FL2 transmitted to all facilities in geographic area requested by pt/family on 11/24/17     FL2 transmitted to all facilities within larger geographic area on       Patient informed that his/her managed care company has contracts with or will negotiate with certain facilities, including the following:        Yes   Patient/family informed of bed offers received.  Patient chooses bed at ([facility] )     Physician recommends and patient chooses bed at      Patient to be transferred to   on  .  Patient to be transferred to facility by       Patient family notified on   of transfer.  Name of family member notified:        PHYSICIAN       Additional Comment:    _______________________________________________ Enes Rokosz, Veronia Beets, LCSW 11/25/2017, 1:50 PM

## 2017-11-25 NOTE — Anesthesia Postprocedure Evaluation (Signed)
Anesthesia Post Note  Patient: Linda Juarez  Procedure(s) Performed: TOTAL HIP ARTHROPLASTY ANTERIOR APPROACH (Right )  Patient location during evaluation: Nursing Unit Anesthesia Type: Spinal Level of consciousness: awake and alert and oriented Pain management: pain level controlled Vital Signs Assessment: post-procedure vital signs reviewed and stable Respiratory status: spontaneous breathing Cardiovascular status: stable Postop Assessment: no headache, no backache, no apparent nausea or vomiting, patient able to bend at knees and adequate PO intake Anesthetic complications: no     Last Vitals:  Vitals:   11/24/17 2357 11/25/17 0347  BP: (!) 153/63 106/79  Pulse: 72 67  Resp: 18 15  Temp: 36.8 C 36.9 C  SpO2: 97% 96%    Last Pain:  Vitals:   11/25/17 0347  TempSrc: Oral  PainSc:                  [name]

## 2017-11-25 NOTE — Progress Notes (Signed)
Per [facility] authorization has been received. Patient can D/C to [facility] when medically stable. Patient is aware of above.   [employer], LCSW [phone]

## 2017-11-25 NOTE — Clinical Social Work Note (Signed)
Clinical Social Work Assessment  Patient Details  Name: Linda Juarez MRN: [id number] Date of Birth: [birth year]  Date of referral:  11/25/17               Reason for consult:  Facility Placement                Permission sought to share information with:  Chartered certified accountant granted to share information::  Yes, Verbal Permission Granted  Name::      [place]::   [place]   Relationship::     Contact Information:     Housing/Transportation Living arrangements for the past 2 months:  [place] of Information:  Patient Patient Interpreter Needed:  None Criminal Activity/Legal Involvement Pertinent to Current Situation/Hospitalization:  No - Comment as needed Significant Relationships:  Friend, Other Family Members Lives with:  Self Do you feel safe going back to the place where you live?  Yes Need for family participation in patient care:  Yes (Comment)  Care giving concerns:  Patient lives alone in [place].    Social Worker assessment / plan:  Holiday representative (CSW) received SNF consult. PT is recommending SNF. CSW met with patient alone at bedside to discuss D/C plan. Patient was alert and oriented X4 and was laying in the bed. CSW introduced self and explained role of CSW department. Patient reported that she lives alone in [place] and her nephew Linda Juarez is her HPOA. Per patient she does not have any children. CSW explained SNF process and that [facility] will have to approve it. Patient reported that she went to [facility] the last time she had an ortho surgery and had a good experience. Patient requested [facility]. FL2 complete and faxed out.   CSW presented bed offers to patient and she chose [facility]. CSW faxed H&P to [facility]. Per Linda Juarez liaison he will stat [facility] SNF authorization. CSW will continue to follow and assist as needed.   Employment status:  Retired Nurse, adult PT Recommendations:  [place] / Referral to community resources:  [place]  Patient/Family's Response to care:  Patient chose [facility].   Patient/Family's Understanding of and Emotional Response to Diagnosis, Current Treatment, and Prognosis:  Patient was very pleasant and thanked CSW for for assistance.   Emotional Assessment Appearance:  Appears stated age Attitude/Demeanor/Rapport:    Affect (typically observed):  Accepting, Adaptable, Pleasant Orientation:  Oriented to Self, Oriented to Place, Oriented to  Time, Oriented to Situation Alcohol / Substance use:  Not Applicable Psych involvement (Current and /or in the community):  No (Comment)  Discharge Needs  Concerns to be addressed:  Discharge Planning Concerns Readmission within the last 30 days:  No Current discharge risk:  Dependent with Mobility Barriers to Discharge:  Continued Medical Work up   [employer], Linda Beets, LCSW 11/25/2017, 1:51 PM

## 2017-11-26 LAB — BASIC METABOLIC PANEL
ANION GAP: 7 (ref 5–15)
BUN: 14 mg/dL (ref 6–20)
CALCIUM: 8.3 mg/dL — AB (ref 8.9–10.3)
CO2: 25 mmol/L (ref 22–32)
CREATININE: 0.79 mg/dL (ref 0.44–1.00)
Chloride: 105 mmol/L (ref 101–111)
GFR calc Af Amer: >60 mL/min (ref >60)
GLUCOSE: 117 mg/dL — AB (ref 65–99)
Potassium: 3.8 mmol/L (ref 3.5–5.1)
Sodium: 137 mmol/L (ref 135–145)

## 2017-11-26 LAB — CBC
HCT: 32.9 % — ABNORMAL LOW (ref 35.0–47.0)
Hemoglobin: 11.1 g/dL — ABNORMAL LOW (ref 12.0–16.0)
MCH: 30.4 pg (ref 26.0–34.0)
MCHC: 33.8 g/dL (ref 32.0–36.0)
MCV: 89.8 fL (ref 80.0–100.0)
PLATELETS: 158 10*3/uL (ref 150–440)
RBC: 3.66 MIL/uL — ABNORMAL LOW (ref 3.80–5.20)
RDW: 13.5 % (ref 11.5–14.5)
WBC: 8.9 10*3/uL (ref 3.6–11.0)

## 2017-11-26 LAB — SURGICAL PATHOLOGY

## 2017-11-26 MED ORDER — DOCUSATE SODIUM 100 MG PO CAPS: 100.0000 mg | ORAL_CAPSULE | Freq: Two times a day (BID) | ORAL | 0 refills | Status: DC

## 2017-11-26 MED ORDER — HYDROCODONE-ACETAMINOPHEN 7.5-325 MG PO TABS: 1.0000 | ORAL_TABLET | ORAL | 0 refills | Status: DC | PRN

## 2017-11-26 MED ORDER — ENOXAPARIN SODIUM 40 MG/0.4ML ~~LOC~~ SOLN: 40.0000 mg | SUBCUTANEOUS | 0 refills | Status: DC

## 2017-11-26 MED ORDER — METHOCARBAMOL 500 MG PO TABS: 500.0000 mg | ORAL_TABLET | Freq: Four times a day (QID) | ORAL | 0 refills | Status: DC | PRN

## 2017-11-26 NOTE — Discharge Instructions (Signed)

## 2017-11-26 NOTE — Progress Notes (Signed)
Patient is medically stable for D/C to [facility] today. Per [facility] authorization has been received and patient can come today to room 806. RN will call report to [facility] at [phone] and arrange EMS for transport. Clinical Education officer, museum (CSW) sent D/C orders to [facility] via [facility]UB. Patient is aware of above. CSW contacted patient's niece Butch Penny and made her aware of above. Please reconsult if future social work needs arise. CSW signing off.   [employer], LCSW [phone]

## 2017-11-26 NOTE — Progress Notes (Signed)
Physical Therapy Treatment Patient Details Name: Linda Juarez MRN: [id number] DOB: [birth year] Today's Date: 11/26/2017    History of Present Illness Pt is an 84 y.o. female s/p R THA anterior approach 11/24/17.  Pt seen for PT evaluation POD #0.  PMH includes L THA 2018.    PT Comments    Pt ambulated 30 ft w/ RW and min assist for R hip flexion in order to progress RLE with proper gait biomechanics. Pt unable to advance RLE on their own (see mobility for details), CGA was provided by second therapist for safety.  Session was limited due to Pt report of mild nausea and dizziness during ambulation at 30 ft (resolved after few min seated rest break).  Pt did not report any increase in R hip pain post session (faces 4/10).  Pt will benefit from continued PT to progress  RLE strength, AROM, and activity tolerance.    Follow Up Recommendations  SNF     Equipment Recommendations  Rolling walker with 5" wheels;3in1 (PT)    Recommendations for Other Services       Precautions / Restrictions Precautions Precautions: Anterior Hip;Fall Precaution Booklet Issued: Yes (comment) Restrictions Weight Bearing Restrictions: Yes RLE Weight Bearing: Weight bearing as tolerated    Mobility  Bed Mobility                  Transfers Overall transfer level: Needs assistance Equipment used: Rolling walker (2 wheeled) Transfers: Sit to/from Stand Sit to Stand: Min assist         General transfer comment: vc's required to scoot to edge of chair, push off from chair with one hand and place the other hand in the middle of the RW, and push through LLE to stand.  Ambulation/Gait Ambulation/Gait assistance: Min assist Ambulation Distance (Feet): 30 Feet Assistive device: Rolling walker (2 wheeled) Gait Pattern/deviations: Step-to pattern;Decreased step length - right;Decreased stance time - right Gait velocity: decreased   General Gait Details: To improve gait pattern, Pt required min  assist for R hip flexion and vc's to extend RLE during swing phase, place R heel down during heel strike, and use UE to offweight RLE during stance to bring LLE forward. Pt was provided CGA by second therapist.   Stairs            Wheelchair Mobility    Modified Rankin (Stroke Patients Only)       Balance Overall balance assessment: Needs assistance Sitting-balance support: No upper extremity supported Sitting balance-Leahy Scale: Fair Sitting balance - Comments: Pt demonstrated fair sitting balance while adjusting clothing and reaching within BOS, while sitting in chair.   Standing balance support: Bilateral upper extremity supported Standing balance-Leahy Scale: Poor Standing balance comment: Pt required BUE support with RW for standing prior to ambulation.                            Cognition Arousal/Alertness: Awake/alert Behavior During Therapy: WFL for tasks assessed/performed Overall Cognitive Status: Within Functional Limits for tasks assessed                                 General Comments: Pt A&O x 4      Exercises      General Comments General comments (skin integrity, edema, etc.): R hip portable wound vac in place      Pertinent Vitals/Pain Pain Assessment: Faces Pain Score:  4  Faces Pain Scale: Hurts little more(End of session) Pain Location: R hip Pain Descriptors / Indicators: Sore;Tender;Guarding Pain Intervention(s): Limited activity within patient's tolerance;Monitored during session;Premedicated before session;Repositioned    Home Living                      Prior Function            PT Goals (current goals can now be found in the care plan section) Acute Rehab PT Goals Patient Stated Goal: to have less R hip pain PT Goal Formulation: With patient Time For Goal Achievement: 12/08/17 Potential to Achieve Goals: Fair Progress towards PT goals: Progressing toward goals    Frequency    BID       PT Plan Current plan remains appropriate    Co-evaluation              AM-PAC PT "6 Clicks" Daily Activity  Outcome Measure  Difficulty turning over in bed (including adjusting bedclothes, sheets and blankets)?: Unable Difficulty moving from lying on back to sitting on the side of the bed? : Unable Difficulty sitting down on and standing up from a chair with arms (e.g., wheelchair, bedside commode, etc,.)?: Unable Help needed moving to and from a bed to chair (including a wheelchair)?: A Little Help needed walking in hospital room?: A Little Help needed climbing 3-5 steps with a railing? : A Lot 6 Click Score: 11    End of Session Equipment Utilized During Treatment: Gait belt Activity Tolerance: Patient limited by pain;Patient limited by fatigue(Pt reported mild dizziness and nausea after ambulating 30 ft. Pt was provided chair and rolled back into room, session was ended.) Patient left: in chair;with call bell/phone within reach;with chair alarm set;with SCD's reapplied(B heels elevated with pillow. ) Nurse Communication: Mobility status PT Visit Diagnosis: Other abnormalities of gait and mobility (R26.89);Muscle weakness (generalized) (M62.81);History of falling (Z91.81);Difficulty in walking, not elsewhere classified (R26.2);Pain Pain - Right/Left: Right Pain - part of body: Hip     Time: 5400-8676 PT Time Calculation (min) (ACUTE ONLY): 26 min  Charges:                       G Codes:         Lamone Ferrelli Mondrian-Pardue, SPT 11/26/2017, 10:01 AM

## 2017-11-26 NOTE — Discharge Summary (Signed)
Physician Discharge Summary  Patient ID: Linda Juarez MRN: [id number] DOB/AGE: 82-Mar-1933 82 y.o.  Admit date: 11/24/2017 Discharge date: 11/26/2017  Admission Diagnoses:  PRIMARY LOCALIZED OSTEOARTHRITIS OF RIGHT HIP   Discharge Diagnoses: Patient Active Problem List   Diagnosis Date Noted  . Primary localized osteoarthritis of right hip 11/24/2017  . Abnormal prominence of clavicle 10/02/2017  . Urinary urgency 10/02/2017  . Edema 01/14/2017  . Health care maintenance 09/29/2016  . Hip pain, bilateral 10/09/2015  . Medicare annual wellness visit, subsequent 01/28/2015  . Advance care planning 01/28/2015  . HLD (hyperlipidemia) 01/16/2015  . Diverticulitis of colon without hemorrhage 06/07/2014    Past Medical History:  Diagnosis Date  . 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 Arthritis    Osteoarthritis  . Diverticulitis   . Diverticulosis of colon without hemorrhage   . GERD (gastroesophageal reflux disease)   . HOH (hard of hearing)   . Hyperlipidemia      Transfusion: none   Consultants (if any):   Discharged Condition: Improved  Hospital Course: Linda Juarez is an 84 y.o. female who was admitted 11/24/2017 with a diagnosis of right hip osteoarthritis and went to the operating room on 11/24/2017 and underwent the above named procedures.    Surgeries: Procedure(s): TOTAL HIP ARTHROPLASTY ANTERIOR APPROACH on 11/24/2017 Patient tolerated the surgery well. Taken to PACU where she was stabilized and then transferred to the orthopedic floor.  Started on Lovenox 40 mg q 24 hrs. Foot pumps applied bilaterally at 80 mm. Heels elevated on bed with rolled towels. No evidence of DVT. Negative Homan. Physical therapy started on day #1 for gait training and transfer. OT started day #1 for ADL and assisted devices.  Patient's foley was d/c on day #1. Patient's IV  was d/c on day #2.  On post op day #2  patient was stable and ready for discharge to skilled nursing facility.  Implants: Medacta AMIS 2 standard stem with 54 mm Mpact cup DM with liner and S 28 mm metal head  Please remove provena negative pressure dressing on 12/03/2017 and apply honey comb dressing. Keep dressing clean and dry at all times.    She was given perioperative antibiotics:  Anti-infectives (From admission, onward)   Start     Dose/Rate Route Frequency Ordered Stop   11/24/17 1400  ceFAZolin (ANCEF) IVPB 2g/100 mL premix     2 g 200 mL/hr over 30 Minutes Intravenous Every 6 hours 11/24/17 1117 11/24/17 2123   11/24/17 0615  ceFAZolin (ANCEF) 2-4 GM/100ML-% IVPB    Comments:  Lyman Bishop   : cabinet override      11/24/17 0615 11/24/17 0731   11/23/17 2230  ceFAZolin (ANCEF) IVPB 2g/100 mL premix     2 g 200 mL/hr over 30 Minutes Intravenous  Once 11/23/17 2227 11/24/17 0801    .  She was given sequential compression devices, early ambulation, and Lovenox for DVT prophylaxis.  She benefited maximally from the hospital stay and there were no complications.    Recent vital signs:  Vitals:   11/25/17 2002 11/26/17 0511  BP: (!) 145/60 (!) 128/47  Pulse: 73 67  Resp: 16 19  Temp: 98.8 F (37.1 C) 99 F (37.2 C)  SpO2: 96% 96%    Recent laboratory studies:  Lab Results  Component Value Date   HGB 11.1 (L) 11/26/2017   HGB 11.2 (L) 11/25/2017  HGB 13.1 11/24/2017   Lab Results  Component Value Date   WBC 8.9 11/26/2017   PLT 158 11/26/2017   Lab Results  Component Value Date   INR 0.97 11/17/2017   Lab Results  Component Value Date   NA 137 11/26/2017   K 3.8 11/26/2017   CL 105 11/26/2017   CO2 25 11/26/2017   BUN 14 11/26/2017   CREATININE 0.79 11/26/2017   GLUCOSE 117 (H) 11/26/2017    Discharge Medications:   Allergies as of 11/26/2017   No Known Allergies     Medication List    STOP taking these medications   ADVIL 200 MG tablet Generic drug:  ibuprofen     TAKE  these medications   acetaminophen 500 MG tablet Commonly known as:  TYLENOL Take 500 mg by mouth daily with lunch.   docusate sodium 100 MG capsule Commonly known as:  COLACE Take 1 capsule (100 mg total) by mouth 2 (two) times daily.   enoxaparin 40 MG/0.4ML injection Commonly known as:  LOVENOX Inject 0.4 mLs (40 mg total) into the skin daily for 14 days.   Garlic 161 MG Caps Take 12,000 mg by mouth every other day.   HYDROcodone-acetaminophen 7.5-325 MG tablet Commonly known as:  NORCO Take 1-2 tablets by mouth every 4 (four) hours as needed for severe pain (pain score 7-10).   methocarbamol 500 MG tablet Commonly known as:  ROBAXIN Take 1 tablet (500 mg total) by mouth every 6 (six) hours as needed for muscle spasms.   multivitamin with minerals Tabs tablet Take 1 tablet by mouth daily. Centrum Silver   PROBIOTIC DAILY PO Take 1 capsule by mouth daily with supper.   sodium chloride 0.65 % Soln nasal spray Commonly known as:  OCEAN Place 1 spray into both nostrils 4 (four) times daily as needed for congestion.            Durable Medical Equipment  (From admission, onward)        Start     Ordered   11/24/17 1117  DME 3 n 1  Once     11/24/17 1117   11/24/17 1117  DME Bedside commode  Once    Question:  Patient needs a bedside commode to treat with the following condition  Answer:  Status post total hip replacement, right   11/24/17 1117   11/24/17 1117  DME Walker rolling  Once    Question:  Patient needs a walker to treat with the following condition  Answer:  Status post total hip replacement, right   11/24/17 1117      Diagnostic Studies: Dg Hip Operative Unilat W Or W/o Pelvis Right  Result Date: 11/24/2017 CLINICAL DATA:  RIGHT total hip arthroplasty EXAM: OPERATIVE RIGHT HIP (WITH PELVIS IF PERFORMED) 3 VIEWS TECHNIQUE: Fluoroscopic spot image(s) were submitted for interpretation post-operatively. COMPARISON:  10/09/2015 FLUOROSCOPY TIME:  0 minutes  12 seconds FINDINGS: Initial image demonstrates osteoarthritic changes of the RIGHT hip joint with joint space narrowing and spur formation. Subsequent images demonstrate placement of a RIGHT hip prosthesis. Tip of the femoral component is not imaged. Bones demineralized. No fracture dislocation identified. IMPRESSION: RIGHT hip prosthesis without acute abnormality. Electronically Signed   By: Lavonia Dana M.D.   On: 11/24/2017 08:57   Dg Hip Unilat W Or W/o Pelvis 2-3 Views Right  Result Date: 11/24/2017 CLINICAL DATA:  84 year old female with a history right hip replacement EXAM: DG HIP (WITH OR WITHOUT PELVIS) 2-3V RIGHT COMPARISON:  05/31/2014 FINDINGS: Early postoperative changes of right hip arthroplasty with surgical staples projecting in the soft tissues, gas within the right hip surgical bed, and surgical drain in place. Components are congruent. No acute fracture identified. No radiopaque foreign body. IMPRESSION: Early surgical changes of right hip arthroplasty without complicating features. Electronically Signed   By: Corrie Mckusick D.O.   On: 11/24/2017 09:48    Disposition: 03-Skilled [place] information for follow-up providers    Hessie Knows, MD Follow up in 2 week(s).   Specialty:  Orthopedic Surgery Contact information: [place] [address] [phone]            Contact information for after-discharge care    Destination    [facility] Follow up.   Service:  Skilled Nursing Contact information: [address] [place] [phone]                   Signed: Feliberto Gottron 11/26/2017, 7:15 AM

## 2017-11-26 NOTE — Progress Notes (Signed)
EMS here to pickup patient.

## 2017-11-26 NOTE — Progress Notes (Signed)
   Subjective: 2 Days Post-Op Procedure(s) (LRB): TOTAL HIP ARTHROPLASTY ANTERIOR APPROACH (Right) Patient reports pain as mild.   Patient is well, and has had no acute complaints or problems Denies any CP, SOB, ABD pain. We will continue therapy today.  Plan is to go Skilled nursing facility after hospital stay.  Objective: Vital signs in last 24 hours: Temp:  [97.7 F (36.5 C)-99 F (37.2 C)] 99 F (37.2 C) (03/07 0511) Pulse Rate:  [61-75] 67 (03/07 0511) Resp:  [16-19] 19 (03/07 0511) BP: (108-145)/(47-61) 128/47 (03/07 0511) SpO2:  [96 %-98 %] 96 % (03/07 0511)  Intake/Output from previous day: 03/06 0701 - 03/07 0700 In: 1100 [P.O.:1100] Out: -  Intake/Output this shift: No intake/output data recorded.  Recent Labs    11/24/17 1133 11/25/17 0348 11/26/17 0500  HGB 13.1 11.2* 11.1*   Recent Labs    11/25/17 0348 11/26/17 0500  WBC 7.9 8.9  RBC 3.70* 3.66*  HCT 33.1* 32.9*  PLT 178 158   Recent Labs    11/25/17 0348 11/26/17 0500  NA 140 137  K 3.8 3.8  CL 108 105  CO2 23 25  BUN 13 14  CREATININE 0.80 0.79  GLUCOSE 127* 117*  CALCIUM 8.5* 8.3*   No results for input(s): LABPT, INR in the last 72 hours.  EXAM General - Patient is Alert, Appropriate and Oriented Extremity - Neurovascular intact Sensation intact distally Intact pulses distally Dorsiflexion/Plantar flexion intact No cellulitis present Compartment soft Dressing - dressing C/D/I and no drainage. Wound vac intact with out drainage. Changed to provena Motor Function - intact, moving foot and toes well on exam.   Past Medical History:  Diagnosis Date  . 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 Arthritis    Osteoarthritis  . Diverticulitis   . Diverticulosis of colon without hemorrhage   . GERD (gastroesophageal reflux disease)   . HOH (hard of hearing)   . Hyperlipidemia     Assessment/Plan:   2 Days  Post-Op Procedure(s) (LRB): TOTAL HIP ARTHROPLASTY ANTERIOR APPROACH (Right) Active Problems:   Primary localized osteoarthritis of right hip  Estimated body mass index is 25.99 kg/m as calculated from the following:   Height as of this encounter: 5\' 6"  (1.676 m).   Weight as of this encounter: 73 kg (161 lb). Advance diet Up with therapy  Needs BM Care management to assist with discharge to skilled nursing facility today. Please remove provena negative pressure dressing on 12/03/2017 and apply honey comb dressing. Keep dressing clean and dry at all times. Follow-up with [facility] in 2 weeks Lovenox daily times 14 days.   DVT Prophylaxis - Lovenox, Foot Pumps and TED hose Weight-Bearing as tolerated to right leg   T. Rachelle Hora, PA-C [place] 11/26/2017, 7:10 AM

## 2017-11-26 NOTE — Clinical Social Work Placement (Signed)
   CLINICAL SOCIAL WORK PLACEMENT  NOTE  Date:  11/26/2017  Patient Details  Name: Linda Juarez MRN: [id number] Date of Birth: [birth year]  Clinical Social Work is seeking post-discharge placement for this patient at the [place] level of care (*CSW will initial, date and re-position this form in  chart as items are completed):  Yes   Patient/family provided with [facility] Work Department's list of facilities offering this level of care within the geographic area requested by the patient (or if unable, by the patient's family).  Yes   Patient/family informed of their freedom to choose among providers that offer the needed level of care, that participate in Medicare, Medicaid or managed care program needed by the patient, have an available bed and are willing to accept the patient.  Yes   Patient/family informed of [facility]'s ownership interest in [facility] and [facility], as well as of the fact that they are under no obligation to receive care at these facilities.  PASRR submitted to EDS on       PASRR number received on       Existing PASRR number confirmed on 11/24/17     FL2 transmitted to all facilities in geographic area requested by pt/family on 11/24/17     FL2 transmitted to all facilities within larger geographic area on       Patient informed that his/her managed care company has contracts with or will negotiate with certain facilities, including the following:        Yes   Patient/family informed of bed offers received.  [facility] chooses [facility] at [facility] )     Physician recommends and patient chooses bed at      Patient to be transferred to ([facility] ) on 11/26/17.  Patient to be transferred to facility by [facility] )     Patient family notified on 11/26/17 of transfer.  Name of family member notified:  (Patient's niece Butch Penny is aware of D/C today. )     PHYSICIAN       Additional Comment:     _______________________________________________ Shonda Mandarino, Veronia Beets, LCSW 11/26/2017, 8:34 AM

## 2017-11-26 NOTE — Progress Notes (Signed)
Report called to [place], LPN at [facility]. EMS has been called. Patient awaiting transport

## 2017-12-28 ENCOUNTER — Ambulatory Visit: Payer: Medicare Other | Admitting: Family Medicine

## 2017-12-28 ENCOUNTER — Encounter: Payer: Self-pay | Admitting: Family Medicine

## 2017-12-28 DIAGNOSIS — M1611 Unilateral primary osteoarthritis, right hip: Secondary | ICD-10-CM | POA: Diagnosis not present

## 2017-12-28 NOTE — Progress Notes (Signed)
S/p hip surgery.   She is using her walker at baseline.  She is working with that.  She wants to graduate to a cane eventually.  She is taking tylenol for pain.  No other pain meds needed.    She still has PT at home 3x/week and that is going well.  She is s/p [facility] rehab stay.  She is walking better.  This has gone better that the rehab from her last surgery.  Mood is good, d/w pt.  She is motivated to get better.  She has some family help in the meantime.    The most difficult task for patient is getting in and out of bed.  She is adjusted to that and clearly improving.  She is careful.    No FCNAVD.  No rash.  BMs are regular.    She still has baseline R medial clavicle changes and wanted that checked again. No new sx but chronically prominent on the R side.    Meds, vitals, and allergies reviewed.   ROS: Per HPI unless specifically indicated in ROS section   nad ncat Neck supple, no LA Right medial clavicle has some baseline prominence but is not tender.  No overlying skin changes. rrr ctab abd soft Crepitus on the R knee with ROM.  Able to bear weight, walking with walker.

## 2017-12-28 NOTE — Patient Instructions (Signed)
Thank you for your effort.  Try using a soft knee brace and use ice as needed.  Keep going with PT.  Take care.  Glad to see you.  Update me as needed.

## 2017-12-29 NOTE — Assessment & Plan Note (Signed)
Status post replacement.  Doing well.  Pain is controlled.  Continue with physical therapy.  She is likely ahead of schedule given her situation and age.  No change in medication.  She does have some crepitus on range of motion on the right knee. We talked about using a soft knee brace and continuing Tylenol as needed.  Update me as needed. She agrees.

## 2018-01-14 LAB — HM MAMMOGRAPHY

## 2018-01-18 ENCOUNTER — Encounter: Payer: Self-pay | Admitting: Family Medicine

## 2018-06-08 ENCOUNTER — Encounter: Payer: Self-pay | Admitting: Family Medicine

## 2018-08-16 ENCOUNTER — Ambulatory Visit: Payer: Medicare Other | Admitting: Family Medicine

## 2018-08-16 ENCOUNTER — Encounter: Payer: Self-pay | Admitting: Family Medicine

## 2018-08-16 VITALS — BP 130/70 | HR 65 | Temp 97.2°F | Ht 66.0 in | Wt 163.5 lb

## 2018-08-16 DIAGNOSIS — H6120 Impacted cerumen, unspecified ear: Secondary | ICD-10-CM

## 2018-08-16 DIAGNOSIS — Z23 Encounter for immunization: Secondary | ICD-10-CM

## 2018-08-16 NOTE — Progress Notes (Signed)
She had hearing clinic eval and needed ear wax removal.   Flu shot today.    No FCNAVD.   She feels well otherwise.  Meds, vitals, and allergies reviewed.   ROS: Per HPI unless specifically indicated in ROS section   nad ncat Cerumen removed from L ear with curette and irrigation.  Minimal residual irritation in the canal.  Recheck tympanic membrane within normal limits. Small amount of cerumen removed from R ear with irrigation.  Recheck tympanic membrane within normal limits.  No complications.  She felt better afterward.

## 2018-08-16 NOTE — Patient Instructions (Signed)
Update me as needed.  Take care.  Glad to see you.   

## 2018-08-21 DIAGNOSIS — H6120 Impacted cerumen, unspecified ear: Secondary | ICD-10-CM | POA: Insufficient documentation

## 2018-08-21 NOTE — Assessment & Plan Note (Signed)
Resolved.  Recheck normal.  Update me as needed.  She agrees.

## 2018-09-29 ENCOUNTER — Other Ambulatory Visit (INDEPENDENT_AMBULATORY_CARE_PROVIDER_SITE_OTHER): Payer: Medicare Other

## 2018-09-29 ENCOUNTER — Other Ambulatory Visit: Payer: Self-pay | Admitting: Family Medicine

## 2018-09-29 ENCOUNTER — Ambulatory Visit: Payer: Medicare Other

## 2018-09-29 DIAGNOSIS — D649 Anemia, unspecified: Secondary | ICD-10-CM

## 2018-09-29 DIAGNOSIS — Z96649 Presence of unspecified artificial hip joint: Secondary | ICD-10-CM

## 2018-09-29 DIAGNOSIS — E785 Hyperlipidemia, unspecified: Secondary | ICD-10-CM

## 2018-09-29 LAB — COMPREHENSIVE METABOLIC PANEL
ALK PHOS: 80 U/L (ref 39–117)
ALT: 17 U/L (ref 0–35)
AST: 22 U/L (ref 0–37)
Albumin: 4.1 g/dL (ref 3.5–5.2)
BUN: 20 mg/dL (ref 6–23)
CO2: 30 mEq/L (ref 19–32)
CREATININE: 0.93 mg/dL (ref 0.40–1.20)
Calcium: 9.6 mg/dL (ref 8.4–10.5)
Chloride: 106 mEq/L (ref 96–112)
GFR: 60.96 mL/min (ref >60.00)
GLUCOSE: 91 mg/dL (ref 70–99)
Potassium: 4.7 mEq/L (ref 3.5–5.1)
Sodium: 142 mEq/L (ref 135–145)
TOTAL PROTEIN: 6.8 g/dL (ref 6.0–8.3)
Total Bilirubin: 0.4 mg/dL (ref 0.2–1.2)

## 2018-09-29 LAB — CBC WITH DIFFERENTIAL/PLATELET
BASOS ABS: 0 10*3/uL (ref 0.0–0.1)
Basophils Relative: 0.6 % (ref 0.0–3.0)
EOS ABS: 0.1 10*3/uL (ref 0.0–0.7)
Eosinophils Relative: 2.1 % (ref 0.0–5.0)
HCT: 43.5 % (ref 36.0–46.0)
Hemoglobin: 14.5 g/dL (ref 12.0–15.0)
LYMPHS ABS: 1.9 10*3/uL (ref 0.7–4.0)
Lymphocytes Relative: 29.7 % (ref 12.0–46.0)
MCHC: 33.4 g/dL (ref 30.0–36.0)
MCV: 90.1 fl (ref 78.0–100.0)
MONO ABS: 0.5 10*3/uL (ref 0.1–1.0)
MONOS PCT: 7.8 % (ref 3.0–12.0)
NEUTROS PCT: 59.8 % (ref 43.0–77.0)
Neutro Abs: 3.8 10*3/uL (ref 1.4–7.7)
PLATELETS: 214 10*3/uL (ref 150.0–400.0)
RBC: 4.83 Mil/uL (ref 3.87–5.11)
RDW: 13.7 % (ref 11.5–15.5)
WBC: 6.3 10*3/uL (ref 4.0–10.5)

## 2018-09-29 LAB — LIPID PANEL
CHOLESTEROL: 235 mg/dL — AB (ref 0–200)
HDL: 41 mg/dL (ref >39.00)
NONHDL: 193.53
TRIGLYCERIDES: 306 mg/dL — AB (ref 0.0–149.0)
Total CHOL/HDL Ratio: 6
VLDL: 61.2 mg/dL — ABNORMAL HIGH (ref 0.0–40.0)

## 2018-09-29 LAB — LDL CHOLESTEROL, DIRECT: LDL DIRECT: 151 mg/dL

## 2018-09-29 LAB — VITAMIN D 25 HYDROXY (VIT D DEFICIENCY, FRACTURES): VITD: 50.93 ng/mL (ref 30.00–100.00)

## 2018-10-04 ENCOUNTER — Ambulatory Visit (INDEPENDENT_AMBULATORY_CARE_PROVIDER_SITE_OTHER): Payer: Medicare Other | Admitting: Family Medicine

## 2018-10-04 ENCOUNTER — Encounter: Payer: Self-pay | Admitting: Family Medicine

## 2018-10-04 VITALS — BP 136/70 | HR 64 | Temp 97.6°F | Ht 66.0 in | Wt 163.0 lb

## 2018-10-04 DIAGNOSIS — Z7189 Other specified counseling: Secondary | ICD-10-CM

## 2018-10-04 DIAGNOSIS — Z0000 Encounter for general adult medical examination without abnormal findings: Secondary | ICD-10-CM

## 2018-10-04 DIAGNOSIS — E785 Hyperlipidemia, unspecified: Secondary | ICD-10-CM

## 2018-10-04 NOTE — Progress Notes (Signed)
I have personally reviewed the Medicare Annual Wellness questionnaire and have noted 1. The patient's medical and social history 2. Their use of alcohol, tobacco or illicit drugs 3. Their current medications and supplements 4. The patient's functional ability including ADL's, fall risks, home safety risks and hearing or visual             impairment. 5. Diet and physical activities 6. Evidence for depression or mood disorders  The patients weight, height, BMI have been recorded in the chart and visual acuity is per eye clinic.  I have made referrals, counseling and provided education to the patient based review of the above and I have provided the pt with a written personalized care plan for preventive services.  Provider list updated- see scanned forms.  Routine anticipatory guidance given to patient.  See health maintenance. The possibility exists that previously documented standard health maintenance information may have been brought forward from a previous encounter into this note.  If needed, that same information has been updated to reflect the current situation based on today's encounter.    Mammogram 2019 DXA 2018, normal.   Colon CA screening not due.   Pap not due.  Vaccines up to date except for shingrix which is out of stock, discussed with patient Advance directive d/w pt- nephew Jonn Shingles designated if patient were incapacitated. She had filled out 5 wishes forms, scanned prev.  Diet and exercise d/w pt.  Diet is good, healthy.   Falls risk - cautions d/w pt.  She is HOH, with hearing aids.     She had elevated lipids but she wasn't fasting so that likely explains the change from prev, d/w pt about diet and exercise.    She does kegel exercises, at baseline.   Her hips are improved with prev surgery.    She is living in independent portion of her housing.  She still drives. Her community has a bus for outings.    PMH and SH reviewed  Meds, vitals, and allergies  reviewed.   ROS: Per HPI.  Unless specifically indicated otherwise in HPI, the patient denies:  General: fever. Eyes: acute vision changes ENT: sore throat Cardiovascular: chest pain Respiratory: SOB GI: vomiting GU: dysuria Musculoskeletal: acute back pain Derm: acute rash Neuro: acute motor dysfunction Psych: worsening mood Endocrine: polydipsia Heme: bleeding Allergy: hayfever  GEN: nad, alert and oriented HEENT: mucous membranes moist NECK: supple w/o LA CV: rrr. PULM: ctab, no inc wob ABD: soft, +bs EXT: no edema SKIN: no acute rash

## 2018-10-04 NOTE — Patient Instructions (Addendum)
Check with your insurance to see if they will cover the shingrix shot. Update me as needed.  Take care.  Glad to see you. 

## 2018-10-06 NOTE — Assessment & Plan Note (Signed)
Advance directive d/w pt- nephew Jonn Shingles designated if patient were incapacitated. She had filled out 5 wishes forms, scanned prev.

## 2018-10-06 NOTE — Assessment & Plan Note (Signed)
Mammogram 2019 DXA 2018, normal.   Colon CA screening not due.   Pap not due.  Vaccines up to date except for shingrix which is out of stock, discussed with patient Advance directive d/w pt- nephew Jonn Shingles designated if patient were incapacitated. She had filled out 5 wishes forms, scanned prev.  Diet and exercise d/w pt.  Diet is good, healthy.   Falls risk - cautions d/w pt.  She is HOH, with hearing aids.

## 2018-10-06 NOTE — Assessment & Plan Note (Signed)
She had elevated lipids but she wasn't fasting so that likely explains the change from prev, d/w pt about diet and exercise.

## 2019-05-24 ENCOUNTER — Encounter: Payer: Self-pay | Admitting: Internal Medicine

## 2019-05-24 ENCOUNTER — Other Ambulatory Visit: Payer: Self-pay

## 2019-05-24 ENCOUNTER — Ambulatory Visit: Payer: Medicare Other | Admitting: Family Medicine

## 2019-05-24 ENCOUNTER — Ambulatory Visit (INDEPENDENT_AMBULATORY_CARE_PROVIDER_SITE_OTHER): Payer: Medicare Other | Admitting: Internal Medicine

## 2019-05-24 VITALS — BP 130/73 | HR 76 | Temp 97.7°F

## 2019-05-24 DIAGNOSIS — Z9049 Acquired absence of other specified parts of digestive tract: Secondary | ICD-10-CM | POA: Diagnosis not present

## 2019-05-24 DIAGNOSIS — R197 Diarrhea, unspecified: Secondary | ICD-10-CM

## 2019-05-24 NOTE — Progress Notes (Signed)
I called and LMOVM for patient at available numbers, asked her to call back.  Staff also tried to call patient prev.  No answer.  I'll defer to patient.

## 2019-05-24 NOTE — Addendum Note (Signed)
Addended by: Jearld Fenton on: 05/24/2019 10:42 AM   Modules accepted: Level of Service

## 2019-05-24 NOTE — Patient Instructions (Signed)

## 2019-05-24 NOTE — Progress Notes (Addendum)
Virtual Visit via Telephone Note  I connected with Linda Juarez on 05/24/19 at 10:30 AM EDT by telephone and verified that I am speaking with the correct person using two identifiers.  Location: Patient: Home Provider: Office   I discussed the limitations, risks, security and privacy concerns of performing an evaluation and management service by telephone and the availability of in person appointments. I also discussed with the patient that there may be a patient responsible charge related to this service. The patient expressed understanding and agreed to proceed.   History of Present Illness:  Pt reports intermittent diarrhea. This started 3 weeks ago. She reports she vomited 1 time 3 weeks ago, then diarrhea lasted 3-4 days, then resolved. Then symptoms restarted 1 week ago after eating chicken fingers and french fries after church. She is having 3 loose stools daily, dark brown in color, no odor, no blood. She has some slight abdominal cramping but reports this is relieved by the bowel movement. She denies recent changes in diet or medications. She denies recent antibiotic use. She has tried [employer] with some relief. She has a history of diverticulosis but reports this does not feel the same. She has had a cholecystectomy in the past and reports bouts of intermittent diarrhea since that time.    Past Medical History:  Diagnosis Date  . 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 Arthritis    Osteoarthritis  . Diverticulitis   . Diverticulosis of colon without hemorrhage   . GERD (gastroesophageal reflux disease)   . HOH (hard of hearing)   . Hx of dysplastic nevus 09/30/2015   R med. dorsal foot  . Hyperlipidemia     Current Outpatient Medications  Medication Sig Dispense Refill  . Garlic [id number] MG CAPS Take 500 mg by mouth daily.     . Multiple Vitamin (MULTIVITAMIN WITH MINERALS) TABS tablet Take 1 tablet  by mouth daily. Centrum Silver    . Probiotic Product (PROBIOTIC DAILY PO) Take 1 capsule by mouth daily with supper.     . sodium chloride (OCEAN) 0.65 % SOLN nasal spray Place 1 spray into both nostrils 4 (four) times daily as needed for congestion.     No current facility-administered medications for this visit.     No Known Allergies  Family History  Problem Relation Age of Onset  . Stroke Mother   . COPD Sister   . Thyroid disease Sister   . Heart disease Brother   . Thyroid disease Other   . Colon cancer Neg Hx   . Colon polyps Neg Hx   . Diabetes Neg Hx   . Kidney disease Neg Hx   . Esophageal cancer Neg Hx   . Breast cancer Neg Hx     Social History   Socioeconomic History  . Marital status: Widowed    Spouse name: Not on file  . Number of children: 0  . Years of education: Not on file  . Highest education level: Not on file  Occupational History  . Occupation: Retired    Fish farm manager: UNEMPLOYED  Social Needs  . Financial resource strain: Not on file  . Food insecurity    Worry: Not on file    Inability: Not on file  . Transportation needs    Medical: Not on file    Non-medical: Not on file  Tobacco Use  . Smoking status: Former Smoker  Packs/day: 0.50    Types: Cigarettes    Quit date: 09/22/1986    Years since quitting: 32.6  . Smokeless tobacco: Never Used  Substance and Sexual Activity  . Alcohol use: Yes    Comment: Occassionally-rare  . Drug use: No  . Sexual activity: Not on file  Lifestyle  . Physical activity    Days per week: Not on file    Minutes per session: Not on file  . Stress: Not on file  Relationships  . Social Herbalist on phone: Not on file    Gets together: Not on file    Attends religious service: Not on file    Active member of club or organization: Not on file    Attends meetings of clubs or organizations: Not on file    Relationship status: Not on file  . Intimate partner violence    Fear of current or ex  partner: Not on file    Emotionally abused: Not on file    Physically abused: Not on file    Forced sexual activity: Not on file  Other Topics Concern  . Not on file  Social History Narrative   Married 1977, widowed 2015- husband had cancer   2 step children, 4 step grand kids   Retired   Living in independent community as of 2020     Constitutional: Denies fever, malaise, fatigue, headache or abrupt weight changes.  HEENT: Denies eye pain, eye redness, ear pain, ringing in the ears, wax buildup, runny nose, nasal congestion, bloody nose, or sore throat. Respiratory: Denies difficulty breathing, shortness of breath, cough or sputum production.   Cardiovascular: Denies chest pain, chest tightness, palpitations or swelling in the hands or feet.  Gastrointestinal: Pt reports diarrhea. Denies abdominal pain, bloating, constipation, or blood in the stool.  GU: Denies urgency, frequency, pain with urination, burning sensation, blood in urine, odor or discharge.  No other specific complaints in a complete review of systems (except as listed in HPI above).  Observations/Objective:  BP 130/73   Pulse 76   Temp 97.7 F (36.5 C) (Oral)  Wt Readings from Last 3 Encounters:  10/04/18 163 lb (73.9 kg)  08/16/18 163 lb 8 oz (74.2 kg)  12/28/17 158 lb 8 oz (71.9 kg)     Neurological: Alert and oriented.l.    BMET    Component Value Date/Time   NA 142 09/29/2018 1318   NA 135 (L) 05/31/2014 0754   K 4.7 09/29/2018 1318   K 3.8 05/31/2014 0754   CL 106 09/29/2018 1318   CL 104 05/31/2014 0754   CO2 30 09/29/2018 1318   CO2 24 05/31/2014 0754   GLUCOSE 91 09/29/2018 1318   GLUCOSE 131 (H) 05/31/2014 0754   BUN 20 09/29/2018 1318   BUN 25 (H) 05/31/2014 0754   CREATININE 0.93 09/29/2018 1318   CREATININE 1.21 05/31/2014 0754   CREATININE 1.02 04/23/2012   CALCIUM 9.6 09/29/2018 1318   CALCIUM 8.9 05/31/2014 0754   GFRNONAA >60 11/26/2017 0500   GFRNONAA 42 (L) 05/31/2014 0754    GFRAA >60 11/26/2017 0500   GFRAA 49 (L) 05/31/2014 0754    Lipid Panel     Component Value Date/Time   CHOL 235 (H) 09/29/2018 1318   TRIG 306.0 (H) 09/29/2018 1318   HDL 41.00 09/29/2018 1318   CHOLHDL 6 09/29/2018 1318   VLDL 61.2 (H) 09/29/2018 1318   LDLCALC 136 (H) 09/23/2017 1156    CBC  Component Value Date/Time   WBC 6.3 09/29/2018 1318   RBC 4.83 09/29/2018 1318   HGB 14.5 09/29/2018 1318   HGB 14.6 05/31/2014 0754   HCT 43.5 09/29/2018 1318   HCT 45.5 05/31/2014 0754   PLT 214.0 09/29/2018 1318   PLT 226 05/31/2014 0754   MCV 90.1 09/29/2018 1318   MCV 91 05/31/2014 0754   MCH 30.4 11/26/2017 0500   MCHC 33.4 09/29/2018 1318   RDW 13.7 09/29/2018 1318   RDW 13.3 05/31/2014 0754   LYMPHSABS 1.9 09/29/2018 1318   MONOABS 0.5 09/29/2018 1318   EOSABS 0.1 09/29/2018 1318   BASOSABS 0.0 09/29/2018 1318    Hgb A1C No results found for: HGBA1C     Assessment and Plan:  Diarrhea:  Likely triggered by fried foods s/p cholecystectomy Advised her to avoid fried foods Consume a bland diet until bowels return to normal Continue probiotic Can take Imodium 1 tab PO BID prn- avoid overuse as this may lead to constipation  Return precautions discussed  Follow Up Instructions:    I discussed the assessment and treatment plan with the patient. The patient was provided an opportunity to ask questions and all were answered. The patient agreed with the plan and demonstrated an understanding of the instructions.   The patient was advised to call back or seek an in-person evaluation if the symptoms worsen or if the condition fails to improve as anticipated.  I provided 5:40 minutes of non-face-to-face time during this encounter.   Webb Silversmith, NP

## 2019-05-25 DIAGNOSIS — Z5329 Procedure and treatment not carried out because of patient's decision for other reasons: Secondary | ICD-10-CM | POA: Insufficient documentation

## 2019-05-25 DIAGNOSIS — Z91199 Patient's noncompliance with other medical treatment and regimen due to unspecified reason: Secondary | ICD-10-CM | POA: Insufficient documentation

## 2019-05-25 NOTE — Assessment & Plan Note (Signed)
I called and LMOVM for patient at available numbers, asked her to call back.  Staff also tried to call patient prev.  No answer.  I'll defer to patient.  I did not want this patient to get charged for no-show appointment.

## 2019-10-09 ENCOUNTER — Other Ambulatory Visit: Payer: Self-pay | Admitting: Family Medicine

## 2019-10-09 DIAGNOSIS — Z96649 Presence of unspecified artificial hip joint: Secondary | ICD-10-CM

## 2019-10-09 DIAGNOSIS — E785 Hyperlipidemia, unspecified: Secondary | ICD-10-CM

## 2019-10-20 ENCOUNTER — Ambulatory Visit (INDEPENDENT_AMBULATORY_CARE_PROVIDER_SITE_OTHER): Payer: Medicare PPO

## 2019-10-20 ENCOUNTER — Other Ambulatory Visit: Payer: Self-pay

## 2019-10-20 ENCOUNTER — Other Ambulatory Visit (INDEPENDENT_AMBULATORY_CARE_PROVIDER_SITE_OTHER): Payer: Medicare PPO

## 2019-10-20 VITALS — BP 159/80 | HR 65

## 2019-10-20 DIAGNOSIS — Z0000 Encounter for general adult medical examination without abnormal findings: Secondary | ICD-10-CM | POA: Diagnosis not present

## 2019-10-20 DIAGNOSIS — Z96649 Presence of unspecified artificial hip joint: Secondary | ICD-10-CM

## 2019-10-20 DIAGNOSIS — E785 Hyperlipidemia, unspecified: Secondary | ICD-10-CM

## 2019-10-20 LAB — LIPID PANEL
Cholesterol: 210 mg/dL — ABNORMAL HIGH (ref 0–200)
HDL: 43.5 mg/dL (ref >39.00)
LDL Cholesterol: 128 mg/dL — ABNORMAL HIGH (ref 0–99)
NonHDL: 166.86
Total CHOL/HDL Ratio: 5
Triglycerides: 193 mg/dL — ABNORMAL HIGH (ref 0.0–149.0)
VLDL: 38.6 mg/dL (ref 0.0–40.0)

## 2019-10-20 LAB — COMPREHENSIVE METABOLIC PANEL
ALT: 31 U/L (ref 0–35)
AST: 28 U/L (ref 0–37)
Albumin: 4.1 g/dL (ref 3.5–5.2)
Alkaline Phosphatase: 85 U/L (ref 39–117)
BUN: 16 mg/dL (ref 6–23)
CO2: 29 mEq/L (ref 19–32)
Calcium: 9.4 mg/dL (ref 8.4–10.5)
Chloride: 105 mEq/L (ref 96–112)
Creatinine, Ser: 0.98 mg/dL (ref 0.40–1.20)
GFR: 53.86 mL/min — ABNORMAL LOW (ref >60.00)
Glucose, Bld: 94 mg/dL (ref 70–99)
Potassium: 4.6 mEq/L (ref 3.5–5.1)
Sodium: 141 mEq/L (ref 135–145)
Total Bilirubin: 0.6 mg/dL (ref 0.2–1.2)
Total Protein: 6.5 g/dL (ref 6.0–8.3)

## 2019-10-20 LAB — VITAMIN D 25 HYDROXY (VIT D DEFICIENCY, FRACTURES): VITD: 43.34 ng/mL (ref 30.00–100.00)

## 2019-10-20 NOTE — Progress Notes (Signed)
Subjective:   Linda Juarez is a 86 y.o. female who presents for Medicare Annual (Subsequent) preventive examination.  Review of Systems: N/A   This visit is being conducted through telemedicine via telephone at the nurse health advisor's home address due to the COVID-19 pandemic. This patient has given me verbal consent via doximity to conduct this visit, patient states they are participating from their home address. Patient and myself are on the telephone call. There is no referral for this visit. Some vital signs may be absent or patient reported.    Patient identification: identified by name, DOB, and current address   Cardiac Risk Factors include: advanced age (>54men, >43 women);dyslipidemia     Objective:     Vitals: BP (!) 159/80   Pulse 65   There is no height or weight on file to calculate BMI.  Advanced Directives 10/20/2019 11/24/2017 11/24/2017 11/17/2017 09/23/2017 12/16/2016 12/10/2016  Does Patient Have a Medical Advance Directive? Yes Yes Yes Yes Yes Yes No  Type of Paramedic of [place];Living will Living will Living will;Healthcare Power of [place];Living will [place];Living will Living will -  Does patient want to make changes to medical advance directive? - No - Patient declined - - - No - Patient declined No - Patient declined  Copy of [place] in Chart? Yes - validated most recent copy scanned in chart (See row information) - No - copy requested - Yes - -    Tobacco Social History   Tobacco Use  Smoking Status Former Smoker  . Packs/day: 0.50  . Types: Cigarettes  . Quit date: 09/22/1986  . Years since quitting: 33.0  Smokeless Tobacco Never Used     Counseling given: Not Answered   Clinical Intake:  Pre-visit preparation completed: Yes  Pain : No/denies pain     Nutritional Risks: None Diabetes: No  How often do you need to have someone help you when you  read instructions, pamphlets, or other written materials from your doctor or pharmacy?: 1 - Never What is the last grade level you completed in school?: some college  Interpreter Needed?: No  Information entered by :: CJohnson, LPN  Past Medical History:  Diagnosis Date  . 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 Arthritis    Osteoarthritis  . Diverticulitis   . Diverticulosis of colon without hemorrhage   . GERD (gastroesophageal reflux disease)   . HOH (hard of hearing)   . Hx of dysplastic nevus 09/30/2015   R med. dorsal foot  . Hyperlipidemia    Past Surgical History:  Procedure Laterality Date  . ABDOMINAL HYSTERECTOMY  1980's  . CATARACT EXTRACTION Left 2015  . CATARACT EXTRACTION W/PHACO Right 06/04/2015   Procedure: CATARACT EXTRACTION PHACO AND INTRAOCULAR LENS PLACEMENT ([place]);  Surgeon: Estill Cotta, MD;  Location: [facility] ORS;  Service: Ophthalmology;  Laterality: Right;  LOT# [id number] H [place]: 01:39.5 AP: 25.9% CDE: 42.63  . CATARACT EXTRACTION, BILATERAL    . CHOLECYSTECTOMY  2006  . SKIN SURGERY  08/06/2017   Dr. Nehemiah Massed  . Stress Cardiolite  04/24/11   Normal perfusion study with no stress induced ischemia  . TONSILLECTOMY AND ADENOIDECTOMY    . TOTAL HIP ARTHROPLASTY Left 12/16/2016   Procedure: TOTAL HIP ARTHROPLASTY ANTERIOR APPROACH;  Surgeon: Hessie Knows, MD;  Location: [facility] ORS;  Service: Orthopedics;  Laterality: Left;  . TOTAL  HIP ARTHROPLASTY Right 11/24/2017   Procedure: TOTAL HIP ARTHROPLASTY ANTERIOR APPROACH;  Surgeon: Hessie Knows, MD;  Location: [facility] ORS;  Service: Orthopedics;  Laterality: Right;   Family History  Problem Relation Age of Onset  . Stroke Mother   . COPD Sister   . Thyroid disease Sister   . Heart disease Brother   . Thyroid disease Other   . Colon cancer Neg Hx   . Colon polyps Neg Hx   . Diabetes Neg Hx   . Kidney disease Neg Hx   . Esophageal cancer  Neg Hx   . Breast cancer Neg Hx    Social History   Socioeconomic History  . Marital status: Widowed    Spouse name: Not on file  . Number of children: 0  . Years of education: Not on file  . Highest education level: Not on file  Occupational History  . Occupation: Retired    Fish farm manager: UNEMPLOYED  Tobacco Use  . Smoking status: Former Smoker    Packs/day: 0.50    Types: Cigarettes    Quit date: 09/22/1986    Years since quitting: 33.0  . Smokeless tobacco: Never Used  Substance and Sexual Activity  . Alcohol use: Yes    Comment: Occassionally-rare  . Drug use: No  . Sexual activity: Not on file  Other Topics Concern  . Not on file  Social History Narrative   Married 1977, widowed 2015- husband had cancer   2 step children, 4 step grand kids   Retired   Therapist, art in independent community as of 2020   Social Determinants of Radio broadcast assistant Strain: [place]   . Difficulty of Paying Living Expenses: Not hard at all  Food Insecurity: No Food Insecurity  . Worried About Charity fundraiser in the Last Year: Never true  . Ran Out of Food in the Last Year: Never true  Transportation Needs: No Transportation Needs  . Lack of Transportation (Medical): No  . Lack of Transportation (Non-Medical): No  Physical Activity: Inactive  . Days of Exercise per Week: 0 days  . Minutes of Exercise per Session: 0 min  Stress: No Stress Concern Present  . Feeling of Stress : Not at all  Social Connections:   . Frequency of Communication with Friends and Family: Not on file  . Frequency of Social Gatherings with Friends and Family: Not on file  . Attends Religious Services: Not on file  . Active Member of Clubs or Organizations: Not on file  . Attends Archivist Meetings: Not on file  . Marital Status: Not on file    Outpatient Encounter Medications as of 10/20/2019  Medication Sig  . Garlic [id number] MG CAPS Take 500 mg by mouth daily.   . Multiple Vitamin (MULTIVITAMIN  WITH MINERALS) TABS tablet Take 1 tablet by mouth daily. Centrum Silver  . Probiotic Product (PROBIOTIC DAILY PO) Take 1 capsule by mouth daily with supper.   . sodium chloride (OCEAN) 0.65 % SOLN nasal spray Place 1 spray into both nostrils 4 (four) times daily as needed for congestion.   No facility-administered encounter medications on file as of 10/20/2019.    Activities of Daily Living In your present state of health, do you have any difficulty performing the following activities: 10/20/2019  Hearing? Y  Comment wears hearing aids  Vision? N  Difficulty concentrating or making decisions? Y  Comment forgets names sometimes  Walking or climbing stairs? N  Dressing or bathing? N  Doing errands, shopping? N  Preparing Food and eating ? N  Using the Toilet? N  In the past six months, have you accidently leaked urine? N  Do you have problems with loss of bowel control? N  Managing your Medications? N  Managing your Finances? N  Housekeeping or managing your Housekeeping? N  Some recent data might be hidden    Patient Care Team: Tonia Ghent, MD as PCP - General (Family Medicine) Hessie Knows, MD as Consulting Physician (Orthopedic Surgery) Ralene Bathe, MD as Referring Physician (Dermatology) Estill Cotta, MD as Referring Physician (Ophthalmology)    Assessment:   This is a routine wellness examination for Linda Juarez.  Exercise Activities and Dietary recommendations Current Exercise Habits: The patient does not participate in regular exercise at present, Exercise limited by: None identified  Goals    . DIET - INCREASE WATER INTAKE     Starting 09/23/2017, I will continue to drink at least 4-6 glasses of water daily.     . Increase water intake     Starting 09/19/2016, I will attempt to drink at least 6-8 glasses of water daily.     . Patient Stated     10/20/2019, I will try to walk more daily.       Fall Risk Fall Risk  10/20/2019 10/04/2018 09/23/2017  09/18/2016 01/26/2015  Falls in the past year? 0 0 Yes Yes No  Comment - - fell during snow storm pt had accidental fall while out shopping -  Number falls in past yr: 0 - 1 1 -  Injury with Fall? 0 - No No -  Risk for fall due to : Impaired mobility - - - -  Follow up Falls evaluation completed;Falls prevention discussed - - - -   Is the patient's home free of loose throw rugs in walkways, pet beds, electrical cords, etc?   yes      Grab bars in the bathroom? yes      Handrails on the stairs?   yes      Adequate lighting?   yes  Timed Get Up and Go performed: N/A  Depression Screen PHQ 2/9 Scores 10/20/2019 10/04/2018 09/23/2017 09/18/2016  PHQ - 2 Score 0 0 0 0  PHQ- 9 Score 0 - 0 -     Cognitive Function MMSE - Mini Mental State Exam 10/20/2019 09/23/2017 09/18/2016  Orientation to time 5 5 5   Orientation to Place 5 5 5   Registration 3 3 3   Attention/ Calculation 5 0 0  Recall 3 3 3   Language- name 2 objects - 0 0  Language- repeat 1 1 1   Language- follow 3 step command - 3 3  Language- read & follow direction - 0 0  Write a sentence - 0 0  Copy design - 0 0  Total score - 20 20  Mini Cog  Mini-Cog screen was completed. Maximum score is 22. A value of 0 denotes this part of the MMSE was not completed or the patient failed this part of the Mini-Cog screening.       Immunization History  Administered Date(s) Administered  . Influenza Whole 07/28/2013  . Influenza, High Dose Seasonal PF 06/30/2017  . Influenza,inj,Quad PF,6+ Mos 10/09/2015, 09/18/2016, 08/16/2018, 06/23/2019  . Influenza-Unspecified 12/25/2014, 06/30/2017  . Pneumococcal Conjugate-13 01/26/2015  . Pneumococcal Polysaccharide-23 12/23/2012  . Td 12/23/2012  . Zoster 03/29/2009    Qualifies for Shingles Vaccine? yes  Screening Tests Health Maintenance  Topic Date Due  . TETANUS/TDAP  12/24/2022  . INFLUENZA VACCINE  Completed  . DEXA SCAN  Completed  . PNA vac Low Risk Adult  Completed    Cancer  Screenings: Lung: Low Dose CT Chest recommended if Age 34-80 years, 30 pack-year currently smoking OR have quit w/in 15years. Patient does not qualify. Breast:  Mammogram: No longer required   Bone Density/Dexa: Completed 10/27/2016 Colorectal: no longer required  Additional Screenings:  Hepatitis C Screening: N/A     Plan:   Patient will try to walk more daily.    I have personally reviewed and noted the following in the patient's chart:   . Medical and social history . Use of alcohol, tobacco or illicit drugs  . Current medications and supplements . Functional ability and status . Nutritional status . Physical activity . Advanced directives . List of other physicians . Hospitalizations, surgeries, and ER visits in previous 12 months . Vitals . Screenings to include cognitive, depression, and falls . Referrals and appointments  In addition, I have reviewed and discussed with patient certain preventive protocols, quality metrics, and best practice recommendations. A written personalized care plan for preventive services as well as general preventive health recommendations were provided to patient.     Andrez Grime, LPN  [id number]

## 2019-10-20 NOTE — Progress Notes (Signed)
PCP notes:  Health Maintenance: No gaps noted   Abnormal Screenings: none   Patient concerns: none   Nurse concerns: none   Next PCP appt.: 10/27/2019 @ 2 pm

## 2019-10-20 NOTE — Patient Instructions (Signed)
Linda Juarez , Thank you for taking time to come for your Medicare Wellness Visit. I appreciate your ongoing commitment to your health goals. Please review the following plan we discussed and let me know if I can assist you in the future.   Screening recommendations/referrals: Colonoscopy: no longer required Mammogram: no longer required Bone Density: completed 10/27/2016 Recommended yearly ophthalmology/optometry visit for glaucoma screening and checkup Recommended yearly dental visit for hygiene and checkup  Vaccinations: Influenza vaccine: Up to date, completed 06/23/2019 Pneumococcal vaccine: Completed series Tdap vaccine: Up to date, completed 12/23/2012 Shingles vaccine: discussed    Advanced directives: copy in chart per patient  Conditions/risks identified: hyperlipidemia  Next appointment: 10/27/2019 @ 2 pm    Preventive Care 84 Years and Older, Female Preventive care refers to lifestyle choices and visits with your health care provider that can promote health and wellness. What does preventive care include?  A yearly physical exam. This is also called an annual well check.  Dental exams once or twice a year.  Routine eye exams. Ask your health care provider how often you should have your eyes checked.  Personal lifestyle choices, including:  Daily care of your teeth and gums.  Regular physical activity.  Eating a healthy diet.  Avoiding tobacco and drug use.  Limiting alcohol use.  Practicing safe sex.  Taking low-dose aspirin every day.  Taking vitamin and mineral supplements as recommended by your health care provider. What happens during an annual well check? The services and screenings done by your health care provider during your annual well check will depend on your age, overall health, lifestyle risk factors, and family history of disease. Counseling  Your health care provider may ask you questions about your:  Alcohol use.  Tobacco use.  Drug  use.  Emotional well-being.  Home and relationship well-being.  Sexual activity.  Eating habits.  History of falls.  Memory and ability to understand (cognition).  Work and work Statistician.  Reproductive health. Screening  You may have the following tests or measurements:  Height, weight, and BMI.  Blood pressure.  Lipid and cholesterol levels. These may be checked every 5 years, or more frequently if you are over 49 years old.  Skin check.  Lung cancer screening. You may have this screening every year starting at age 84 if you have a 30-pack-year history of smoking and currently smoke or have quit within the past 15 years.  Fecal occult blood test (FOBT) of the stool. You may have this test every year starting at age 84.  Flexible sigmoidoscopy or colonoscopy. You may have a sigmoidoscopy every 5 years or a colonoscopy every 10 years starting at age 84.  Hepatitis C blood test.  Hepatitis B blood test.  Sexually transmitted disease (STD) testing.  Diabetes screening. This is done by checking your blood sugar (glucose) after you have not eaten for a while (fasting). You may have this done every 1-3 years.  Bone density scan. This is done to screen for osteoporosis. You may have this done starting at age 84.  Mammogram. This may be done every 1-2 years. Talk to your health care provider about how often you should have regular mammograms. Talk with your health care provider about your test results, treatment options, and if necessary, the need for more tests. Vaccines  Your health care provider may recommend certain vaccines, such as:  Influenza vaccine. This is recommended every year.  Tetanus, diphtheria, and acellular pertussis (Tdap, Td) vaccine. You may need a  Td booster every 10 years.  Zoster vaccine. You may need this after age 35.  Pneumococcal 13-valent conjugate (PCV13) vaccine. One dose is recommended after age 84.  Pneumococcal polysaccharide  (PPSV23) vaccine. One dose is recommended after age 84. Talk to your health care provider about which screenings and vaccines you need and how often you need them. This information is not intended to replace advice given to you by your health care provider. Make sure you discuss any questions you have with your health care provider. Document Released: 10/05/2015 Document Revised: 05/28/2016 Document Reviewed: 07/10/2015 Elsevier Interactive Patient Education  2017 [place] Prevention in the Home Falls can cause injuries. They can happen to people of all ages. There are many things you can do to make your home safe and to help prevent falls. What can I do on the outside of my home?  Regularly fix the edges of walkways and driveways and fix any cracks.  Remove anything that might make you trip as you walk through a door, such as a raised step or threshold.  Trim any bushes or trees on the path to your home.  Use bright outdoor lighting.  Clear any walking paths of anything that might make someone trip, such as rocks or tools.  Regularly check to see if handrails are loose or broken. Make sure that both sides of any steps have handrails.  Any raised decks and porches should have guardrails on the edges.  Have any leaves, snow, or ice cleared regularly.  Use sand or salt on walking paths during winter.  Clean up any spills in your garage right away. This includes oil or grease spills. What can I do in the bathroom?  Use night lights.  Install grab bars by the toilet and in the tub and shower. Do not use towel bars as grab bars.  Use non-skid mats or decals in the tub or shower.  If you need to sit down in the shower, use a plastic, non-slip stool.  Keep the floor dry. Clean up any water that spills on the floor as soon as it happens.  Remove soap buildup in the tub or shower regularly.  Attach bath mats securely with double-sided non-slip rug tape.  Do not have  throw rugs and other things on the floor that can make you trip. What can I do in the bedroom?  Use night lights.  Make sure that you have a light by your bed that is easy to reach.  Do not use any sheets or blankets that are too big for your bed. They should not hang down onto the floor.  Have a firm chair that has side arms. You can use this for support while you get dressed.  Do not have throw rugs and other things on the floor that can make you trip. What can I do in the kitchen?  Clean up any spills right away.  Avoid walking on wet floors.  Keep items that you use a lot in easy-to-reach places.  If you need to reach something above you, use a strong step stool that has a grab bar.  Keep electrical cords out of the way.  Do not use floor polish or wax that makes floors slippery. If you must use wax, use non-skid floor wax.  Do not have throw rugs and other things on the floor that can make you trip. What can I do with my stairs?  Do not leave any items on the stairs.  Make sure that there are handrails on both sides of the stairs and use them. Fix handrails that are broken or loose. Make sure that handrails are as long as the stairways.  Check any carpeting to make sure that it is firmly attached to the stairs. Fix any carpet that is loose or worn.  Avoid having throw rugs at the top or bottom of the stairs. If you do have throw rugs, attach them to the floor with carpet tape.  Make sure that you have a light switch at the top of the stairs and the bottom of the stairs. If you do not have them, ask someone to add them for you. What else can I do to help prevent falls?  Wear shoes that:  Do not have high heels.  Have rubber bottoms.  Are comfortable and fit you well.  Are closed at the toe. Do not wear sandals.  If you use a stepladder:  Make sure that it is fully opened. Do not climb a closed stepladder.  Make sure that both sides of the stepladder are  locked into place.  Ask someone to hold it for you, if possible.  Clearly mark and make sure that you can see:  Any grab bars or handrails.  First and last steps.  Where the edge of each step is.  Use tools that help you move around (mobility aids) if they are needed. These include:  Canes.  Walkers.  Scooters.  Crutches.  Turn on the lights when you go into a dark area. Replace any light bulbs as soon as they burn out.  Set up your furniture so you have a clear path. Avoid moving your furniture around.  If any of your floors are uneven, fix them.  If there are any pets around you, be aware of where they are.  Review your medicines with your doctor. Some medicines can make you feel dizzy. This can increase your chance of falling. Ask your doctor what other things that you can do to help prevent falls. This information is not intended to replace advice given to you by your health care provider. Make sure you discuss any questions you have with your health care provider. Document Released: 07/05/2009 Document Revised: 02/14/2016 Document Reviewed: 10/13/2014 Elsevier Interactive Patient Education  2017 [employer].

## 2019-10-27 ENCOUNTER — Ambulatory Visit (INDEPENDENT_AMBULATORY_CARE_PROVIDER_SITE_OTHER): Payer: Medicare PPO | Admitting: Family Medicine

## 2019-10-27 ENCOUNTER — Other Ambulatory Visit: Payer: Self-pay

## 2019-10-27 ENCOUNTER — Encounter: Payer: Self-pay | Admitting: Family Medicine

## 2019-10-27 DIAGNOSIS — E785 Hyperlipidemia, unspecified: Secondary | ICD-10-CM | POA: Diagnosis not present

## 2019-10-27 DIAGNOSIS — Q740 Other congenital malformations of upper limb(s), including shoulder girdle: Secondary | ICD-10-CM | POA: Diagnosis not present

## 2019-10-27 DIAGNOSIS — R252 Cramp and spasm: Secondary | ICD-10-CM | POA: Diagnosis not present

## 2019-10-27 DIAGNOSIS — Z0000 Encounter for general adult medical examination without abnormal findings: Secondary | ICD-10-CM

## 2019-10-27 NOTE — Progress Notes (Signed)
This visit occurred during the SARS-CoV-2 public health emergency.  Safety protocols were in place, including screening questions prior to the visit, additional usage of staff PPE, and extensive cleaning of exam room while observing appropriate contact time as indicated for disinfecting solutions.  Mammogram 2019 DXA 2018, normal.  Colon CA screening not due.  Pap not due.  Vaccines d/w pt.  Tetanus 2014 PNA up to date.   Flu 2020.   Shingrix d/w pt.  covid vaccine d/w pt.  Done 10/07/19 with next dose tomorrow.  Advance directive d/w pt- nephew Jonn Shingles designated if patient were incapacitated. She had filled out 5 wishes forms, scannedprev. Diet and exercise d/w pt.  Off statin but lipids better than prev.  No CP, SOB, BLE edema.  She thought she had some aches on lipitor.  Discussed.  She has used mustard for PM nonexertional cramps. She was going to try to inc PO fluids and try apple cider vinegar.  She does not have claudication.  She'll check her BP at home and update me.   She has a prominence at the right medial clavicle that is an old finding.  Not tender.  See exam below.  No dysphagia.  No wheeze.  Meds, vitals, and allergies reviewed.   ROS: Per HPI unless specifically indicated in ROS section   GEN: nad, alert and oriented HEENT: ncat NECK: supple w/o LA, she has a prominence of the right medial clavicle but this is not tender.  No carotid bruit bilaterally.  No lymphadenopathy locally.  No erythema.  No bruising. CV: rrr. PULM: ctab, no inc wob ABD: soft, +bs EXT: no edema SKIN: no acute rash

## 2019-10-27 NOTE — Patient Instructions (Addendum)
You can call for a mammogram at [facility] at [facility].  [place] with your insurance to see if they will cover the shingles shot after you have the covid vaccine.    Check your BP at home at rest in the morning after sitting down for a few minutes.  Take care.  Glad to see you.

## 2019-10-30 DIAGNOSIS — R252 Cramp and spasm: Secondary | ICD-10-CM | POA: Insufficient documentation

## 2019-10-30 NOTE — Assessment & Plan Note (Signed)
Mammogram 2019 DXA 2018, normal.  Colon CA screening not due.  Pap not due.  Vaccines d/w pt.  Tetanus 2014 PNA up to date.   Flu 2020.   Shingrix d/w pt.  covid vaccine d/w pt.  Done 10/07/19 with next dose tomorrow.  Advance directive d/w pt- nephew Jonn Shingles designated if patient were incapacitated. She had filled out 5 wishes forms, scannedprev. Diet and exercise d/w pt.

## 2019-10-30 NOTE — Assessment & Plan Note (Signed)
Benign exam.  Discussed.  No bruit.  No alarming findings.  Would follow clinically.

## 2019-10-30 NOTE — Assessment & Plan Note (Signed)
Off statin but lipids better than prev.  No CP, SOB, BLE edema.  She thought she had some aches on lipitor.  Discussed. Would continue as is.  Off statin.

## 2019-10-30 NOTE — Assessment & Plan Note (Signed)
She has used mustard for PM nonexertional cramps. She was going to try to inc PO fluids and try apple cider vinegar.  She does not have claudication.  She will update me as needed.

## 2019-11-11 ENCOUNTER — Telehealth: Payer: Self-pay | Admitting: Family Medicine

## 2019-11-11 NOTE — Telephone Encounter (Signed)
Patient stated she was advised to call back about her BP readings. She stated that her bp jumps up and down so much. She had wrote several days down that she is wanting to mail into [place] instead. Tried to get some readings from her over the phone.but she stated that she is going to her Physical Therapist to check her bp and her at home BP monitor to see if the readings are accurate   FYI

## 2019-11-11 NOTE — Telephone Encounter (Signed)
Noted.  I'll await the follow up readings.  Thanks.

## 2019-11-28 ENCOUNTER — Telehealth: Payer: Self-pay

## 2019-11-28 NOTE — Telephone Encounter (Signed)
Noted! Thank you

## 2019-11-28 NOTE — Telephone Encounter (Signed)
Patient returned call Advised of message below Patient stated she understood. And will conintue as is

## 2019-11-28 NOTE — Telephone Encounter (Signed)
Left message for patient to call back.  Per Dr. Damita Dunnings: he received patient's b/p readings and stated that BPs are reasonable, continue as is. Thank you for the note.

## 2019-12-09 ENCOUNTER — Ambulatory Visit (INDEPENDENT_AMBULATORY_CARE_PROVIDER_SITE_OTHER): Payer: Medicare PPO | Admitting: Family Medicine

## 2019-12-09 ENCOUNTER — Encounter: Payer: Self-pay | Admitting: Family Medicine

## 2019-12-09 VITALS — BP 175/92 | HR 69 | Ht 66.0 in

## 2019-12-09 DIAGNOSIS — A084 Viral intestinal infection, unspecified: Secondary | ICD-10-CM | POA: Insufficient documentation

## 2019-12-09 NOTE — Progress Notes (Signed)
VIRTUAL VISIT Due to national recommendations of social distancing due to [place] 19, a virtual visit is felt to be most appropriate for this patient at this time.   I connected with the patient on 12/09/19 at  2:20 PM EDT by virtual telehealth platform and verified that I am speaking with the correct person using two identifiers.   Interactive audio and video telecommunications were attempted between this provider and patient, however failed, due to patient having technical difficulties OR patient did not have access to video capability.  We continued and completed visit with audio only.   I discussed the limitations, risks, security and privacy concerns of performing an evaluation and management service by  virtual telehealth platform and the availability of in person appointments. I also discussed with the patient that there may be a patient responsible charge related to this service. The patient expressed understanding and agreed to proceed.  Patient location: Home Provider Location: [facility] Participants: Eliezer Lofts and [name]   Chief Complaint  Patient presents with  . Emesis    Stomach started bothering her on Wednesday  . Abdominal Cramping  . Diarrhea    History of Present Illness: 86 year old female presents for new onset emesis, abdominal cramping and diarrhea in last 3 days.  Started after eating at friends house 3 days ago. Awoke at night with emesis and diarrhea.  No blood in stool. No fever.  Since then continue diarrhea, dark green. No further emesis. Also started with  mild abdominal cramping, relived with BM. No overt pain.  She has been able to have chicken soup. Has been keeping up with liquids. Minimal nausea.   Nml urine output.   No sick contacts.  No recent antibiotics, no recent travel.  No stream water.     COVID 19 screen No recent travel or known exposure to COVID19 The patient denies respiratory symptoms of COVID 19 at this  time.  The importance of social distancing was discussed today.  Has had both COVID vaccines.  Review of Systems  Constitutional: Negative for chills and fever.  HENT: Negative for congestion and ear pain.   Eyes: Negative for pain and redness.  Respiratory: Negative for cough and shortness of breath.   Cardiovascular: Negative for chest pain, palpitations and leg swelling.  Gastrointestinal: Positive for abdominal pain, diarrhea, nausea and vomiting. Negative for blood in stool and constipation.  Genitourinary: Negative for dysuria.  Musculoskeletal: Negative for falls and myalgias.  Skin: Negative for rash.  Neurological: Negative for dizziness.  Psychiatric/Behavioral: Negative for depression. The patient is not nervous/anxious.       Past Medical History:  Diagnosis Date  . 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 Arthritis    Osteoarthritis  . Diverticulitis   . Diverticulosis of colon without hemorrhage   . GERD (gastroesophageal reflux disease)   . HOH (hard of hearing)   . Hx of dysplastic nevus 09/30/2015   R med. dorsal foot  . Hyperlipidemia     reports that she quit smoking about 33 years ago. Her smoking use included cigarettes. She smoked 0.50 packs per day. She has never used smokeless tobacco. She reports current alcohol use. She reports that she does not use drugs.   Current Outpatient Medications:  .  Garlic XX123456 MG CAPS, Take 2,000 mg by mouth daily. , Disp: , Rfl:  .  Multiple Vitamin (MULTIVITAMIN WITH MINERALS)  TABS tablet, Take 1 tablet by mouth daily. Centrum Silver, Disp: , Rfl:  .  Probiotic Product (PROBIOTIC DAILY PO), Take 1 capsule by mouth daily with supper. , Disp: , Rfl:  .  sodium chloride (OCEAN) 0.65 % SOLN nasal spray, Place 1 spray into both nostrils 4 (four) times daily as needed for congestion., Disp: , Rfl:    Observations/Objective: There were no vitals taken for  this visit.  Physical Exam  Physical Exam Constitutional:      General: The patient is not in acute distress. Pulmonary:     Effort: Pulmonary effort is normal. No respiratory distress.  Neurological:     Mental Status: The patient is alert and oriented to person, place, and time.  Psychiatric:        Mood and Affect: Mood normal.        Behavior: Behavior normal.   Assessment and Plan Viral gastroenteritis Rest, fluids to keep hydrated.  Seems to be resolving. Continue symptomatic care.  Return precautions and ER precaustions given.  I discussed the assessment and treatment plan with the patient. The patient was provided an opportunity to ask questions and all were answered. The patient agreed with the plan and demonstrated an understanding of the instructions.   The patient was advised to call back or seek an in-person evaluation if the symptoms worsen or if the condition fails to improve as anticipated.  Total visit time 20 minutes, > 50% spent counseling and cordinating patients care.     Eliezer Lofts, MD

## 2019-12-09 NOTE — Assessment & Plan Note (Signed)
Rest, fluids to keep hydrated.  Seems to be resolving. Continue symptomatic care.  Return precautions and ER precaustions given.

## 2019-12-12 ENCOUNTER — Telehealth: Payer: Self-pay | Admitting: Family Medicine

## 2019-12-12 NOTE — Telephone Encounter (Signed)
Pt called stating she is not any better from her appointment with dr Diona Browner on 3/19.  She stated she is still having watery diarrhea.  She stated she is drinking water  And gingerale. Made appointment with you 3/25.  Is it ok for pt to wait till then

## 2019-12-12 NOTE — Telephone Encounter (Signed)
I left v/m on pts only contact # to call [facility] for triage.

## 2019-12-12 NOTE — Telephone Encounter (Signed)
Spoke to patient and was advised that she started with diarrhea and vomiting Wednesday. Patient stated that the diarrhea was dark green Thursday and Friday. Patient stated that she was some better Saturday and able to eat bland food. Patient stated that she has had a little stomach pain. Patient started that it seems that she not able to digest her food. Patient stated that she has been doing a little better since starting the bland food diet. Patient stated that now the diarrhea seems to be nothing but water. Patient stated that she is urinating like she should and does not think that she is dehydrated because she is drinking plenty of water and sticking with a bland diet. Patient stated that she thinks she is okay to wait until Thursday for her appointment. ER precautions were given to patient and she verbalized understanding.

## 2019-12-12 NOTE — Telephone Encounter (Signed)
Please triage patient.  That will help [place] make decisions about follow up.  Thanks.

## 2019-12-13 NOTE — Telephone Encounter (Signed)
Patient states she is doing better but is still very weak.  Patient has been able to progress with her diet and drinks lots of water.  Diarrhea has improved.  Patient was offered an appt from a cancellation at 10:45 but states she doesn't think she can get ready and be here by that time and since she is improving, she wishes to just wait till Thursday for her appointment.

## 2019-12-13 NOTE — Telephone Encounter (Signed)
Noted.  Given her age and situation, please get update on patient again on Tuesday.  If she is doing worse then let me know.  This may improve in the meantime without extra intervention.  Thanks.

## 2019-12-14 NOTE — Telephone Encounter (Signed)
Noted.  Thanks.  I am going to be out of clinic due to an unexpected emergency and she will need to get rescheduled.  Thanks.

## 2019-12-14 NOTE — Telephone Encounter (Signed)
Pt scheduled with Dr Diona Browner on 12/16/19 at 12pm  Nothing further needed.

## 2019-12-15 ENCOUNTER — Ambulatory Visit: Payer: Medicare PPO | Admitting: Family Medicine

## 2019-12-16 ENCOUNTER — Ambulatory Visit: Payer: Medicare PPO | Admitting: Family Medicine

## 2019-12-16 ENCOUNTER — Other Ambulatory Visit: Payer: Self-pay

## 2019-12-16 ENCOUNTER — Encounter: Payer: Self-pay | Admitting: Family Medicine

## 2019-12-16 VITALS — BP 110/72 | HR 76 | Temp 98.7°F | Ht 66.0 in | Wt 146.5 lb

## 2019-12-16 DIAGNOSIS — R197 Diarrhea, unspecified: Secondary | ICD-10-CM

## 2019-12-16 DIAGNOSIS — R634 Abnormal weight loss: Secondary | ICD-10-CM

## 2019-12-16 DIAGNOSIS — R1013 Epigastric pain: Secondary | ICD-10-CM | POA: Diagnosis not present

## 2019-12-16 LAB — CBC WITH DIFFERENTIAL/PLATELET
Basophils Absolute: 0 10*3/uL (ref 0.0–0.1)
Basophils Relative: 0.6 % (ref 0.0–3.0)
Eosinophils Absolute: 0.1 10*3/uL (ref 0.0–0.7)
Eosinophils Relative: 1.9 % (ref 0.0–5.0)
HCT: 42.8 % (ref 36.0–46.0)
Hemoglobin: 14.2 g/dL (ref 12.0–15.0)
Lymphocytes Relative: 24.4 % (ref 12.0–46.0)
Lymphs Abs: 1.8 10*3/uL (ref 0.7–4.0)
MCHC: 33.1 g/dL (ref 30.0–36.0)
MCV: 91.5 fl (ref 78.0–100.0)
Monocytes Absolute: 0.5 10*3/uL (ref 0.1–1.0)
Monocytes Relative: 7 % (ref 3.0–12.0)
Neutro Abs: 4.8 10*3/uL (ref 1.4–7.7)
Neutrophils Relative %: 66.1 % (ref 43.0–77.0)
Platelets: 221 10*3/uL (ref 150.0–400.0)
RBC: 4.67 Mil/uL (ref 3.87–5.11)
RDW: 12.9 % (ref 11.5–15.5)
WBC: 7.2 10*3/uL (ref 4.0–10.5)

## 2019-12-16 LAB — COMPREHENSIVE METABOLIC PANEL
ALT: 58 U/L — ABNORMAL HIGH (ref 0–35)
AST: 45 U/L — ABNORMAL HIGH (ref 0–37)
Albumin: 4 g/dL (ref 3.5–5.2)
Alkaline Phosphatase: 86 U/L (ref 39–117)
BUN: 15 mg/dL (ref 6–23)
CO2: 30 mEq/L (ref 19–32)
Calcium: 9.3 mg/dL (ref 8.4–10.5)
Chloride: 104 mEq/L (ref 96–112)
Creatinine, Ser: 0.95 mg/dL (ref 0.40–1.20)
GFR: 55.81 mL/min — ABNORMAL LOW (ref >60.00)
Glucose, Bld: 95 mg/dL (ref 70–99)
Potassium: 3.7 mEq/L (ref 3.5–5.1)
Sodium: 140 mEq/L (ref 135–145)
Total Bilirubin: 0.6 mg/dL (ref 0.2–1.2)
Total Protein: 6.2 g/dL (ref 6.0–8.3)

## 2019-12-16 LAB — LIPASE: Lipase: 29 U/L (ref 11.0–59.0)

## 2019-12-16 MED ORDER — OMEPRAZOLE 40 MG PO CPDR: 40.0000 mg | DELAYED_RELEASE_CAPSULE | Freq: Every day | ORAL | 3 refills | Status: DC

## 2019-12-16 NOTE — Patient Instructions (Addendum)
Please stop at the lab to have labs drawn. Start omeprazole 40 mg daily.  Follow up in 2 weeks with Dr. Damita Dunnings or Dr. Diona Browner if not improving.

## 2019-12-16 NOTE — Progress Notes (Signed)
Chief Complaint  Patient presents with  . Follow-up    Viral Gastroenteritis-Seen on 3/19-not feeling better    History of Present Illness: HPI  86 year old female pt of Dr. Josefine Class with history of  diverticulitis presents for follow up viral gastroenteritis.   She was seen by myself on 12/09/2019 virtually with emesis, abd cramping and diarrhea following eating out at friends house. Dx: with viral gastroenteritis   No further emesis, but she has still had dark green stools, watery. Improved temporarily but then has returned. Some cramping relieved with BM.  No blood in stool.  Eaing bland foods  She is eating and drinking okay.  Mild upset stomach after eating  In last few month.. stomach felt firm, feels food doesn't move through well... no heartburn.Carolan Clines helped it ease. She thinks she has been eating well, but early satiety.  She has lost weight with this issue as well as almost 20 lbs from 10/05/2019 Wt Readings from Last 3 Encounters:  12/16/19 146 lb 8 oz (66.5 kg)  10/27/19 153 lb 9 oz (69.7 kg)  10/04/18 163 lb (73.9 kg)    Using probiotic.   no recent  antibiotics, no med chnges. No sick contact, no well, no travel.  She lives in [place].  This visit occurred during the SARS-CoV-2 public health emergency.  Safety protocols were in place, including screening questions prior to the visit, additional usage of staff PPE, and extensive cleaning of exam room while observing appropriate contact time as indicated for disinfecting solutions.   COVID 19 screen:  No recent travel or known exposure to COVID19 The patient denies respiratory symptoms of COVID 19 at this time. The importance of social distancing was discussed today.     Review of Systems  Constitutional: Negative for chills and fever.  HENT: Negative for congestion and ear pain.   Eyes: Negative for pain and redness.  Respiratory: Negative for cough and shortness of breath.    Cardiovascular: Negative for chest pain, palpitations and leg swelling.  Genitourinary: Negative for dysuria.  Musculoskeletal: Negative for falls and myalgias.  Skin: Negative for rash.  Neurological: Negative for dizziness.  Psychiatric/Behavioral: Negative for depression. The patient is not nervous/anxious.       Past Medical History:  Diagnosis Date  . 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 Arthritis    Osteoarthritis  . Diverticulitis   . Diverticulosis of colon without hemorrhage   . GERD (gastroesophageal reflux disease)   . HOH (hard of hearing)   . Hx of dysplastic nevus 09/30/2015   R med. dorsal foot  . Hyperlipidemia     reports that she quit smoking about 33 years ago. Her smoking use included cigarettes. She smoked 0.50 packs per day. She has never used smokeless tobacco. She reports current alcohol use. She reports that she does not use drugs.   Current Outpatient Medications:  .  Garlic XX123456 MG CAPS, Take 2,000 mg by mouth daily. , Disp: , Rfl:  .  Multiple Vitamin (MULTIVITAMIN WITH MINERALS) TABS tablet, Take 1 tablet by mouth daily. Centrum Silver, Disp: , Rfl:  .  Probiotic Product (PROBIOTIC DAILY PO), Take 1 capsule by mouth daily with supper. , Disp: , Rfl:  .  sodium chloride (OCEAN) 0.65 % SOLN nasal spray, Place 1 spray into both nostrils 4 (four) times daily as needed for congestion., Disp: , Rfl:  Observations/Objective: Blood pressure 110/72, pulse 76, temperature 98.7 F (37.1 C), temperature source Temporal, height 5\' 6"  (1.676 m), weight 146 lb 8 oz (66.5 kg), SpO2 97 %.  Physical Exam Constitutional:      General: She is not in acute distress.    Appearance: She is well-developed. She is not ill-appearing or toxic-appearing.  HENT:     Head: Normocephalic.     Right Ear: Hearing, tympanic membrane, ear canal and external ear normal. Tympanic membrane is not  erythematous, retracted or bulging.     Left Ear: Hearing, tympanic membrane, ear canal and external ear normal. Tympanic membrane is not erythematous, retracted or bulging.     Nose: No mucosal edema or rhinorrhea.     Right Sinus: No maxillary sinus tenderness or frontal sinus tenderness.     Left Sinus: No maxillary sinus tenderness or frontal sinus tenderness.     Mouth/Throat:     Pharynx: Uvula midline.  Eyes:     General: Lids are normal. Lids are everted, no foreign bodies appreciated.     Conjunctiva/sclera: Conjunctivae normal.     Pupils: Pupils are equal, round, and reactive to light.  Neck:     Thyroid: No thyroid mass or thyromegaly.     Vascular: No carotid bruit.     Trachea: Trachea normal.  Cardiovascular:     Rate and Rhythm: Normal rate and regular rhythm.     Pulses: Normal pulses.     Heart sounds: Normal heart sounds, S1 normal and S2 normal. No murmur. No friction rub. No gallop.   Pulmonary:     Effort: Pulmonary effort is normal. No tachypnea or respiratory distress.     Breath sounds: Normal breath sounds. No decreased breath sounds, wheezing, rhonchi or rales.  Abdominal:     General: Bowel sounds are normal.     Palpations: Abdomen is soft.     Tenderness: There is no abdominal tenderness.  Musculoskeletal:     Cervical back: Normal range of motion and neck supple.  Skin:    General: Skin is warm and dry.     Findings: No rash.  Neurological:     Mental Status: She is alert.  Psychiatric:        Mood and Affect: Mood is not anxious or depressed.        Speech: Speech normal.        Behavior: Behavior normal. Behavior is cooperative.        Thought Content: Thought content normal.        Judgment: Judgment normal.      Assessment and Plan   Epigastric pain  Start PPI and eval with labs to rule out liver or pancreas issue.  Abnormal weight loss Likely due to acute GI issues. Encouraged return to   Diarrhea ? Due to resolving viral GE or  other etiology.  Push fluids to avoid dehydration.     Eliezer Lofts, MD

## 2019-12-19 ENCOUNTER — Telehealth: Payer: Self-pay | Admitting: Family Medicine

## 2019-12-19 NOTE — Telephone Encounter (Signed)
Patient stated she was in on Friday to see Dr Diona Browner and she is wanting to know if her lab results have come back yet  Please advise

## 2019-12-20 ENCOUNTER — Encounter: Payer: Self-pay | Admitting: Family Medicine

## 2019-12-20 ENCOUNTER — Other Ambulatory Visit (INDEPENDENT_AMBULATORY_CARE_PROVIDER_SITE_OTHER): Payer: Medicare PPO

## 2019-12-20 DIAGNOSIS — R1013 Epigastric pain: Secondary | ICD-10-CM | POA: Diagnosis not present

## 2019-12-20 DIAGNOSIS — R197 Diarrhea, unspecified: Secondary | ICD-10-CM | POA: Diagnosis not present

## 2019-12-20 DIAGNOSIS — R634 Abnormal weight loss: Secondary | ICD-10-CM | POA: Diagnosis not present

## 2019-12-20 NOTE — Telephone Encounter (Signed)
[facility] Night - Client Nonclinical Telephone Record AccessNurse Client [facility] Night - Client Client Site [place] - Night Physician Eliezer Lofts - MD Contact Type Call Who Is Calling Patient / Member / Family / Caregiver Caller Name Dewar Phone Number [phone] Patient Name same Patient DOB Nov 03, 1931 Call Type Message Only Information Provided Reason for Call Request for Lab/Test Results Initial Comment Caller states she is calling to see if her bloodwork was back yet and she was wanting to get the results. Additional Comment Provided caller with office hours from profile. Caller declined RN triage. Disp. Time Disposition Final User 12/19/2019 5:13:04 PM General Information Provided Yes Hassie Bruce Call Closed By: Hassie Bruce Transaction Date/Time: 12/19/2019 5:10:13 PM (ET)

## 2019-12-20 NOTE — Telephone Encounter (Signed)
Lab results discussed with Linda Juarez.  She states she dropped off her stool sample today.   She states today she almost feels back to normal and for the first time in two week her stools were more solid.  Will mail patient letter with lab results per her request.

## 2019-12-20 NOTE — Telephone Encounter (Signed)
Patient hasn't brought stool sample back yet.

## 2019-12-20 NOTE — Telephone Encounter (Signed)
Left message for Ms. Mitrovich to return call to discuss lab results.

## 2019-12-20 NOTE — Telephone Encounter (Signed)
Call patient .. I had reported results yet given I am still waiting for gastrointestinal infection panel to return. I have contacted lab to ask about this test.  Otherwise labs were normal... no anemia, no kidney issue, nml electrolytes... just slightly elevated liver function tests which can occur with a viral infection.  How is she feeling?

## 2019-12-22 LAB — GASTROINTESTINAL PATHOGEN PANEL PCR
C. difficile Tox A/B, PCR: NOT DETECTED
Campylobacter, PCR: NOT DETECTED
Cryptosporidium, PCR: NOT DETECTED
E coli (ETEC) LT/ST PCR: NOT DETECTED
E coli (STEC) stx1/stx2, PCR: NOT DETECTED
E coli 0157, PCR: NOT DETECTED
Giardia lamblia, PCR: NOT DETECTED
Norovirus, PCR: NOT DETECTED
Rotavirus A, PCR: NOT DETECTED
Salmonella, PCR: NOT DETECTED
Shigella, PCR: NOT DETECTED

## 2019-12-22 NOTE — Telephone Encounter (Signed)
Linda Juarez notified by telephone that stool test results are still pending.

## 2019-12-22 NOTE — Telephone Encounter (Signed)
Patient is calling about her stool sample She wanted to know if the results have come back for this

## 2019-12-26 DIAGNOSIS — Z8249 Family history of ischemic heart disease and other diseases of the circulatory system: Secondary | ICD-10-CM | POA: Diagnosis not present

## 2019-12-26 DIAGNOSIS — K2970 Gastritis, unspecified, without bleeding: Secondary | ICD-10-CM | POA: Diagnosis not present

## 2019-12-26 DIAGNOSIS — Z823 Family history of stroke: Secondary | ICD-10-CM | POA: Diagnosis not present

## 2019-12-26 DIAGNOSIS — Z809 Family history of malignant neoplasm, unspecified: Secondary | ICD-10-CM | POA: Diagnosis not present

## 2019-12-26 DIAGNOSIS — Z96649 Presence of unspecified artificial hip joint: Secondary | ICD-10-CM | POA: Diagnosis not present

## 2019-12-26 DIAGNOSIS — Z87891 Personal history of nicotine dependence: Secondary | ICD-10-CM | POA: Diagnosis not present

## 2020-01-02 ENCOUNTER — Other Ambulatory Visit: Payer: Self-pay

## 2020-01-02 ENCOUNTER — Ambulatory Visit: Payer: Medicare PPO | Admitting: Family Medicine

## 2020-01-02 ENCOUNTER — Encounter: Payer: Self-pay | Admitting: Family Medicine

## 2020-01-02 VITALS — BP 152/84 | HR 69 | Temp 97.4°F | Ht 66.0 in | Wt 148.6 lb

## 2020-01-02 DIAGNOSIS — R197 Diarrhea, unspecified: Secondary | ICD-10-CM | POA: Diagnosis not present

## 2020-01-02 NOTE — Patient Instructions (Signed)
Stop all dairy in the meantime.  Keep drinking plenty of fluids.  Go to the lab on the way out.   If you have mychart we'll likely use that to update you.    We'll see about possibly getting imaging done after I see your labs.  We may need to get you to see the GI clinic.  Take care.  Glad to see you.

## 2020-01-02 NOTE — Progress Notes (Signed)
This visit occurred during the SARS-CoV-2 public health emergency.  Safety protocols were in place, including screening questions prior to the visit, additional usage of staff PPE, and extensive cleaning of exam room while observing appropriate contact time as indicated for disinfecting solutions.  Sx started about 1 month ago.  She vomited once, then diarrhea thereafter.  Office visit done in the interval.  Discussed.  She kept having loose/liquid stools. Had neg GI panel but LFT elevation noted.  She prev had fecal urgency after each meal until yesterday.  She still has loose stools. She is taking omeprazole 40mg  a day.    She has h/o episodic loose stools/vomiting that would happen at night and then resolve soon thereafter.  This had happened episodically over the years but she is clearly had prolonged symptoms with this episode.  Recently her abd cramping is better.  She doesn't have oily stools.  No fevers.  Marland Kitchenoldmed  GEN: nad, alert and oriented HEENT: ncat NECK: supple w/o LA CV: rrr. PULM: ctab, no inc wob ABD: soft, +bs, not tender to palpation. EXT: no edema SKIN: no acute rash

## 2020-01-03 LAB — CBC WITH DIFFERENTIAL/PLATELET
Basophils Absolute: 0.1 10*3/uL (ref 0.0–0.1)
Basophils Relative: 1.1 % (ref 0.0–3.0)
Eosinophils Absolute: 0.2 10*3/uL (ref 0.0–0.7)
Eosinophils Relative: 2.8 % (ref 0.0–5.0)
HCT: 36.6 % (ref 36.0–46.0)
Hemoglobin: 12.3 g/dL (ref 12.0–15.0)
Lymphocytes Relative: 24.7 % (ref 12.0–46.0)
Lymphs Abs: 1.5 10*3/uL (ref 0.7–4.0)
MCHC: 33.6 g/dL (ref 30.0–36.0)
MCV: 90.6 fl (ref 78.0–100.0)
Monocytes Absolute: 0.5 10*3/uL (ref 0.1–1.0)
Monocytes Relative: 8.3 % (ref 3.0–12.0)
Neutro Abs: 3.8 10*3/uL (ref 1.4–7.7)
Neutrophils Relative %: 63.1 % (ref 43.0–77.0)
Platelets: 201 10*3/uL (ref 150.0–400.0)
RBC: 4.04 Mil/uL (ref 3.87–5.11)
RDW: 13.5 % (ref 11.5–15.5)
WBC: 6 10*3/uL (ref 4.0–10.5)

## 2020-01-03 LAB — COMPREHENSIVE METABOLIC PANEL
ALT: 32 U/L (ref 0–35)
AST: 24 U/L (ref 0–37)
Albumin: 3.8 g/dL (ref 3.5–5.2)
Alkaline Phosphatase: 87 U/L (ref 39–117)
BUN: 12 mg/dL (ref 6–23)
CO2: 29 mEq/L (ref 19–32)
Calcium: 8.9 mg/dL (ref 8.4–10.5)
Chloride: 105 mEq/L (ref 96–112)
Creatinine, Ser: 1 mg/dL (ref 0.40–1.20)
GFR: 52.59 mL/min — ABNORMAL LOW (ref >60.00)
Glucose, Bld: 126 mg/dL — ABNORMAL HIGH (ref 70–99)
Potassium: 3.1 mEq/L — ABNORMAL LOW (ref 3.5–5.1)
Sodium: 143 mEq/L (ref 135–145)
Total Bilirubin: 0.4 mg/dL (ref 0.2–1.2)
Total Protein: 5.8 g/dL — ABNORMAL LOW (ref 6.0–8.3)

## 2020-01-04 NOTE — Assessment & Plan Note (Signed)
Unclear source.  We talked about options.  Reasonable to recheck labs today as that may direct further work-up.  Unclear if she had an infectious diarrhea with subsequent transient lactose intolerance related to previous infection.  Reasonable to cut out all dairy in the meantime and see if that makes a difference.  No change in medications otherwise.  See notes on labs.  She agrees.  Still okay for outpatient follow-up.

## 2020-01-11 ENCOUNTER — Telehealth: Payer: Self-pay

## 2020-01-11 DIAGNOSIS — R1013 Epigastric pain: Secondary | ICD-10-CM | POA: Insufficient documentation

## 2020-01-11 DIAGNOSIS — R634 Abnormal weight loss: Secondary | ICD-10-CM | POA: Insufficient documentation

## 2020-01-11 DIAGNOSIS — R197 Diarrhea, unspecified: Secondary | ICD-10-CM

## 2020-01-11 NOTE — Assessment & Plan Note (Signed)
Start PPI and eval with labs to rule out liver or pancreas issue.

## 2020-01-11 NOTE — Telephone Encounter (Signed)
Pt left v/m; last seen 01/02/20 with diarrhea; pt was better for short period but now back to diarrhea again and pt request referral to GI. Pt request cb.

## 2020-01-11 NOTE — Assessment & Plan Note (Addendum)
?   Due to resolving viral GE or other etiology.  Push fluids to avoid dehydration.

## 2020-01-11 NOTE — Telephone Encounter (Signed)
Left message on voicemail for patient to call back. 

## 2020-01-11 NOTE — Assessment & Plan Note (Signed)
Likely due to acute GI issues. Encouraged return to

## 2020-01-11 NOTE — Telephone Encounter (Signed)
Patient returned call. Advised of Dr Josefine Class message below.  Patient voiced understanding and will call back if worsen before she hears from GI about appointment

## 2020-01-11 NOTE — Telephone Encounter (Signed)
Done.  Please let her know the GI clinic should be calling her about the appointment.  If she is feeling dramatically worse in the meantime then please let me know/call the triage line.  Thanks.

## 2020-01-12 ENCOUNTER — Encounter: Payer: Self-pay | Admitting: *Deleted

## 2020-02-07 ENCOUNTER — Encounter: Payer: Self-pay | Admitting: Gastroenterology

## 2020-02-07 ENCOUNTER — Other Ambulatory Visit: Payer: Self-pay

## 2020-02-07 ENCOUNTER — Ambulatory Visit: Payer: Medicare PPO | Admitting: Gastroenterology

## 2020-02-07 VITALS — BP 199/75 | HR 68 | Temp 98.1°F | Ht 65.0 in | Wt 146.0 lb

## 2020-02-07 DIAGNOSIS — R1030 Lower abdominal pain, unspecified: Secondary | ICD-10-CM

## 2020-02-07 DIAGNOSIS — R197 Diarrhea, unspecified: Secondary | ICD-10-CM | POA: Diagnosis not present

## 2020-02-07 NOTE — Patient Instructions (Signed)
Please remember to not to eat or drink after midnight the night before your CT Scan.

## 2020-02-07 NOTE — Progress Notes (Signed)
Linda Juarez [address]  [place]  [place], [place] [address]  Main: [phone]  Fax: [phone]   Gastroenterology Consultation  Referring Provider:     Tonia Ghent, MD Primary Care Physician:  Tonia Ghent, MD Reason for Consultation:   Diarrhea        HPI:    Chief Complaint  Patient presents with  . New Patient (Initial Visit)  . Diarrhea    Patient stated that she has had diarrhea for the past 2 months but it has gotten better.  . Abdominal Pain    Patient stated that she has had abdominal pain for the past 2 months.    Linda Juarez is a 86 y.o. y/o female referred for consultation & management  by Dr. Damita Dunnings, Elveria Rising, MD.  Patient presents with her niece, who also helps provide history.  However, patient herself is a good historian, but is a little hard of hearing.  Patient reports 8 weeks of diarrhea, 3 loose bowel movements a day.  Saw her primary care provider about 4 weeks ago who recommended a lactose-free diet and patient has noticed symptom improvement with this and is now having 1 bowel movement a day which is sometimes loose and sometimes formed like her baseline.  No blood in stool.  No abdominal pain or nausea vomiting.  Stool infectious work-up was negative recently  However, she does report about once or twice a year she used to have nausea vomiting diarrhea episodes that would recur but would be short-lived and improved.  Reports history of a remote colonoscopy years ago, does not know results.  States it was well over 10 years ago..  Did see Dr. Deatra Ina of GI in Dec 29, 2013 and due to abnormal CT scan at that time showing wall thickening of distal ileum and thickening at the sigmoid colon, recommended a colonoscopy.  However, patient did not get this done, and apparently was dealing with death in the family in 2013-12-29.  Past Medical History:  Diagnosis Date  . 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 Arthritis    Osteoarthritis  . Diverticulitis   . Diverticulosis of colon without hemorrhage   . GERD (gastroesophageal reflux disease)   . HOH (hard of hearing)   . Hx of dysplastic nevus 09/30/2015   R med. dorsal foot  . Hyperlipidemia     Past Surgical History:  Procedure Laterality Date  . ABDOMINAL HYSTERECTOMY  1980's  . CATARACT EXTRACTION Left 12-29-13  . CATARACT EXTRACTION W/PHACO Right 06/04/2015   Procedure: CATARACT EXTRACTION PHACO AND INTRAOCULAR LENS PLACEMENT ([place]);  Surgeon: Estill Cotta, MD;  Location: [facility] ORS;  Service: Ophthalmology;  Laterality: Right;  LOT# [id number] H [place]: 01:39.5 AP: 25.9% CDE: 42.63  . CATARACT EXTRACTION, BILATERAL    . CHOLECYSTECTOMY  12-29-2004  . SKIN SURGERY  08/06/2017   Dr. Nehemiah Massed  . Stress Cardiolite  04/24/11   Normal perfusion study with no stress induced ischemia  . TONSILLECTOMY AND ADENOIDECTOMY    . TOTAL HIP ARTHROPLASTY Left 12/16/2016   Procedure: TOTAL HIP ARTHROPLASTY ANTERIOR APPROACH;  Surgeon: Hessie Knows, MD;  Location: [facility] ORS;  Service: Orthopedics;  Laterality: Left;  . TOTAL HIP ARTHROPLASTY Right 11/24/2017   Procedure: TOTAL HIP ARTHROPLASTY ANTERIOR APPROACH;  Surgeon: Hessie Knows, MD;  Location: [facility] ORS;  Service: Orthopedics;  Laterality: Right;    Prior to Admission medications   Medication  Sig Start Date End Date Taking? Authorizing Provider  Garlic XX123456 MG CAPS Take 2,000 mg by mouth daily.    Yes [provider]  Multiple Vitamin (MULTIVITAMIN WITH MINERALS) TABS tablet Take 1 tablet by mouth daily. Centrum Silver   Yes [provider]  omeprazole (PRILOSEC) 40 MG capsule Take 1 capsule (40 mg total) by mouth daily. 12/16/19  Yes Bedsole, Amy E, MD  Probiotic Product (PROBIOTIC DAILY PO) Take 1 capsule by mouth daily with supper.    Yes [provider]  sodium chloride (OCEAN) 0.65 % SOLN nasal spray Place 1 spray into both nostrils 4 (four) times  daily as needed for congestion.   Yes [provider]    Family History  Problem Relation Age of Onset  . Stroke Mother   . COPD Sister   . Thyroid disease Sister   . Heart disease Brother   . Thyroid disease Other   . Colon cancer Neg Hx   . Colon polyps Neg Hx   . Diabetes Neg Hx   . Kidney disease Neg Hx   . Esophageal cancer Neg Hx   . Breast cancer Neg Hx      Social History   Tobacco Use  . Smoking status: Former Smoker    Packs/day: 0.50    Types: Cigarettes    Quit date: 09/22/1986    Years since quitting: 33.4  . Smokeless tobacco: Never Used  Substance Use Topics  . Alcohol use: Yes    Comment: Occassionally-rare  . Drug use: No    Allergies as of 02/07/2020  . (No Known Allergies)    Review of Systems:    All systems reviewed and negative except where noted in HPI.   Physical Exam:  BP (!) 199/75   Pulse 68   Temp 98.1 F (36.7 C) (Oral)   Ht 5\' 5"  (1.651 m)   Wt 146 lb (66.2 kg)   BMI 24.30 kg/m  No LMP recorded. Patient has had a hysterectomy. Psych:  Alert and cooperative. Normal mood and affect. General:   Alert,  Well-developed, well-nourished, pleasant and cooperative in NAD Head:  Normocephalic and atraumatic. Eyes:  Sclera clear, no icterus.   Conjunctiva pink. Ears:  Normal auditory acuity. Nose:  No deformity, discharge, or lesions. Mouth:  No deformity or lesions,oropharynx pink & moist. Neck:  Supple; no masses or thyromegaly. Abdomen:  Normal bowel sounds.  No bruits.  Soft, non-tender and non-distended without masses, hepatosplenomegaly or hernias noted.  No guarding or rebound tenderness.    Msk:  Symmetrical without gross deformities. Good, equal movement & strength bilaterally. Pulses:  Normal pulses noted. Extremities:  No clubbing or edema.  No cyanosis. Neurologic:  Alert and oriented x3;  grossly normal neurologically. Skin:  Intact without significant lesions or rashes. No jaundice. Lymph Nodes:  No significant  cervical adenopathy. Psych:  Alert and cooperative. Normal mood and affect.   Labs: CBC    Component Value Date/Time   WBC 6.0 01/02/2020 1544   RBC 4.04 01/02/2020 1544   HGB 12.3 01/02/2020 1544   HGB 14.6 05/31/2014 0754   HCT 36.6 01/02/2020 1544   HCT 45.5 05/31/2014 0754   PLT 201.0 01/02/2020 1544   PLT 226 05/31/2014 0754   MCV 90.6 01/02/2020 1544   MCV 91 05/31/2014 0754   MCH 30.4 11/26/2017 0500   MCHC 33.6 01/02/2020 1544   RDW 13.5 01/02/2020 1544   RDW 13.3 05/31/2014 0754   LYMPHSABS 1.5 01/02/2020 1544  MONOABS 0.5 01/02/2020 1544   EOSABS 0.2 01/02/2020 1544   BASOSABS 0.1 01/02/2020 1544   CMP     Component Value Date/Time   NA 143 01/02/2020 1544   NA 135 (L) 05/31/2014 0754   K 3.1 (L) 01/02/2020 1544   K 3.8 05/31/2014 0754   CL 105 01/02/2020 1544   CL 104 05/31/2014 0754   CO2 29 01/02/2020 1544   CO2 24 05/31/2014 0754   GLUCOSE 126 (H) 01/02/2020 1544   GLUCOSE 131 (H) 05/31/2014 0754   BUN 12 01/02/2020 1544   BUN 25 (H) 05/31/2014 0754   CREATININE 1.00 01/02/2020 1544   CREATININE 1.21 05/31/2014 0754   CREATININE 1.02 04/23/2012 0000   CALCIUM 8.9 01/02/2020 1544   CALCIUM 8.9 05/31/2014 0754   PROT 5.8 (L) 01/02/2020 1544   PROT 7.5 05/31/2014 0754   ALBUMIN 3.8 01/02/2020 1544   ALBUMIN 3.6 05/31/2014 0754   AST 24 01/02/2020 1544   AST 18 05/31/2014 0754   ALT 32 01/02/2020 1544   ALT 21 05/31/2014 0754   ALKPHOS 87 01/02/2020 1544   ALKPHOS 111 05/31/2014 0754   BILITOT 0.4 01/02/2020 1544   BILITOT 1.2 (H) 05/31/2014 0754   GFRNONAA >60 11/26/2017 0500   GFRNONAA 42 (L) 05/31/2014 0754   GFRAA >60 11/26/2017 0500   GFRAA 49 (L) 05/31/2014 0754    Imaging Studies: No results found.  Assessment and Plan:   Linda Juarez is a 86 y.o. y/o female has been referred for diarrhea  Will obtain fecal calprotectin and pancreatic elastase Stop omeprazole as patient is not having any upper abdominal symptoms and this  can contribute to diarrhea.  However, diarrhea was present before omeprazole started.  Patient's CT scan in 2015 was abnormal, and she reports intermittent nausea vomiting diarrhea once or twice a year before, but had 8 weeks of diarrhea with negative infectious work-up recently.  Will repeat CT scan to evaluate if abnormality seen before are still present  Continue lactose-free diet as this is helping  Depending on above results, can determine further steps including discussing colonoscopy if appropriate   Dr Linda Juarez  Speech recognition software was used to dictate the above note.

## 2020-02-08 ENCOUNTER — Ambulatory Visit: Payer: Medicare PPO | Admitting: Gastroenterology

## 2020-02-09 DIAGNOSIS — R1030 Lower abdominal pain, unspecified: Secondary | ICD-10-CM | POA: Diagnosis not present

## 2020-02-09 DIAGNOSIS — R197 Diarrhea, unspecified: Secondary | ICD-10-CM | POA: Diagnosis not present

## 2020-02-15 LAB — CALPROTECTIN, FECAL: Calprotectin, Fecal: 58 ug/g (ref 0–120)

## 2020-02-15 LAB — PANCREATIC ELASTASE, FECAL: Pancreatic Elastase, Fecal: 239 ug Elast./g (ref >200)

## 2020-02-16 ENCOUNTER — Ambulatory Visit
Admission: RE | Admit: 2020-02-16 | Discharge: 2020-02-16 | Disposition: A | Discharge status: Home / Self Care | Payer: Medicare PPO | Source: Ambulatory Visit | Attending: Gastroenterology | Admitting: Gastroenterology

## 2020-02-16 ENCOUNTER — Other Ambulatory Visit: Payer: Self-pay

## 2020-02-16 DIAGNOSIS — R197 Diarrhea, unspecified: Secondary | ICD-10-CM | POA: Diagnosis not present

## 2020-02-16 DIAGNOSIS — R1030 Lower abdominal pain, unspecified: Secondary | ICD-10-CM | POA: Diagnosis not present

## 2020-02-16 DIAGNOSIS — R109 Unspecified abdominal pain: Secondary | ICD-10-CM | POA: Diagnosis not present

## 2020-02-16 LAB — POCT I-STAT CREATININE: Creatinine, Ser: 0.9 mg/dL (ref 0.44–1.00)

## 2020-02-16 MED ORDER — IOHEXOL 300 MG/ML  SOLN
85.0000 mL | Freq: Once | INTRAMUSCULAR | Status: AC | PRN
  Administered 2020-02-16: 85 mL via INTRAVENOUS

## 2020-02-17 ENCOUNTER — Telehealth: Payer: Self-pay

## 2020-02-17 NOTE — Telephone Encounter (Signed)
Called patient to let her know that her CT Scan showed wall thickening at the end of her small bowel. Therefore, due to her symptoms Dr. Bonna Gains was recommending to do a colonoscopy on her. Patient agreed, however, she will call me back since she wanted to check her calendar first. Awaiting on patient's call.

## 2020-02-17 NOTE — Telephone Encounter (Signed)
-----   Message from Virgel Manifold, MD sent at 02/17/2020 10:06 AM EDT ----- Herb Grays please let the patient know, her CT scan shows wall thickening at the end of her small bowel.  Given her symptoms I would recommend colonoscopy at this time.  Please schedule at [facility] for diarrhea, and abnormal CT scan

## 2020-02-22 ENCOUNTER — Other Ambulatory Visit: Payer: Self-pay

## 2020-02-22 DIAGNOSIS — R197 Diarrhea, unspecified: Secondary | ICD-10-CM

## 2020-02-22 DIAGNOSIS — R9389 Abnormal findings on diagnostic imaging of other specified body structures: Secondary | ICD-10-CM

## 2020-02-22 NOTE — Telephone Encounter (Signed)
Patient called back and we were able to decide on a date to do her colonoscopy (03/06/2020) at [facility]. Patient was told her instructions, location and if she had any questions, to call me back. Patient understood and had no further questions. Patient was informed that I would mail her her paperwork.

## 2020-03-02 ENCOUNTER — Other Ambulatory Visit: Payer: Self-pay

## 2020-03-02 ENCOUNTER — Other Ambulatory Visit
Admission: RE | Admit: 2020-03-02 | Discharge: 2020-03-02 | Disposition: A | Discharge status: Home / Self Care | Payer: Medicare PPO | Source: Ambulatory Visit | Attending: Gastroenterology | Admitting: Gastroenterology

## 2020-03-02 DIAGNOSIS — Z20822 Contact with and (suspected) exposure to covid-19: Secondary | ICD-10-CM | POA: Insufficient documentation

## 2020-03-02 DIAGNOSIS — Z01812 Encounter for preprocedural laboratory examination: Secondary | ICD-10-CM | POA: Insufficient documentation

## 2020-03-03 LAB — SARS CORONAVIRUS 2 (TAT 6-24 HRS): SARS Coronavirus 2: NEGATIVE

## 2020-03-06 ENCOUNTER — Encounter
Admission: RE | Disposition: A | Discharge status: Home / Self Care | Payer: Self-pay | Source: Home / Self Care | Attending: Gastroenterology

## 2020-03-06 ENCOUNTER — Ambulatory Visit: Payer: Medicare PPO | Admitting: Anesthesiology

## 2020-03-06 ENCOUNTER — Ambulatory Visit
Admission: RE | Admit: 2020-03-06 | Discharge: 2020-03-06 | Disposition: A | Discharge status: Home / Self Care | Payer: Medicare PPO | Attending: Gastroenterology | Admitting: Gastroenterology

## 2020-03-06 ENCOUNTER — Other Ambulatory Visit: Payer: Self-pay

## 2020-03-06 ENCOUNTER — Encounter: Payer: Self-pay | Admitting: Gastroenterology

## 2020-03-06 DIAGNOSIS — R197 Diarrhea, unspecified: Secondary | ICD-10-CM

## 2020-03-06 DIAGNOSIS — K635 Polyp of colon: Secondary | ICD-10-CM

## 2020-03-06 DIAGNOSIS — R9389 Abnormal findings on diagnostic imaging of other specified body structures: Secondary | ICD-10-CM | POA: Diagnosis not present

## 2020-03-06 DIAGNOSIS — K5730 Diverticulosis of large intestine without perforation or abscess without bleeding: Secondary | ICD-10-CM | POA: Insufficient documentation

## 2020-03-06 DIAGNOSIS — D120 Benign neoplasm of cecum: Secondary | ICD-10-CM | POA: Insufficient documentation

## 2020-03-06 DIAGNOSIS — Z87891 Personal history of nicotine dependence: Secondary | ICD-10-CM | POA: Insufficient documentation

## 2020-03-06 DIAGNOSIS — K648 Other hemorrhoids: Secondary | ICD-10-CM | POA: Insufficient documentation

## 2020-03-06 DIAGNOSIS — M1990 Unspecified osteoarthritis, unspecified site: Secondary | ICD-10-CM | POA: Diagnosis not present

## 2020-03-06 DIAGNOSIS — K5790 Diverticulosis of intestine, part unspecified, without perforation or abscess without bleeding: Secondary | ICD-10-CM | POA: Diagnosis not present

## 2020-03-06 DIAGNOSIS — R933 Abnormal findings on diagnostic imaging of other parts of digestive tract: Secondary | ICD-10-CM | POA: Diagnosis not present

## 2020-03-06 DIAGNOSIS — Z79899 Other long term (current) drug therapy: Secondary | ICD-10-CM | POA: Insufficient documentation

## 2020-03-06 DIAGNOSIS — Z96643 Presence of artificial hip joint, bilateral: Secondary | ICD-10-CM | POA: Insufficient documentation

## 2020-03-06 DIAGNOSIS — K219 Gastro-esophageal reflux disease without esophagitis: Secondary | ICD-10-CM | POA: Insufficient documentation

## 2020-03-06 DIAGNOSIS — D122 Benign neoplasm of ascending colon: Secondary | ICD-10-CM | POA: Diagnosis not present

## 2020-03-06 HISTORY — PX: COLONOSCOPY WITH PROPOFOL: SHX5780

## 2020-03-06 SURGERY — COLONOSCOPY WITH PROPOFOL
Anesthesia: General

## 2020-03-06 MED ORDER — SODIUM CHLORIDE 0.9 % IV SOLN: INTRAVENOUS | Status: DC

## 2020-03-06 MED ORDER — LIDOCAINE HCL (CARDIAC) PF 100 MG/5ML IV SOSY
PREFILLED_SYRINGE | INTRAVENOUS | Status: DC | PRN
  Administered 2020-03-06: 100 mg via INTRAVENOUS

## 2020-03-06 MED ORDER — LIDOCAINE HCL (PF) 2 % IJ SOLN
INTRAMUSCULAR | Status: AC
  Filled 2020-03-06: qty 10

## 2020-03-06 MED ORDER — PROPOFOL 10 MG/ML IV BOLUS
INTRAVENOUS | Status: AC
  Filled 2020-03-06: qty 20

## 2020-03-06 MED ORDER — PROPOFOL 500 MG/50ML IV EMUL
INTRAVENOUS | Status: DC | PRN
  Administered 2020-03-06: 150 ug/kg/min via INTRAVENOUS

## 2020-03-06 MED ORDER — PROPOFOL 500 MG/50ML IV EMUL
INTRAVENOUS | Status: AC
  Filled 2020-03-06: qty 50

## 2020-03-06 MED ORDER — PROPOFOL 10 MG/ML IV BOLUS
INTRAVENOUS | Status: DC | PRN
  Administered 2020-03-06: 50 mg via INTRAVENOUS
  Administered 2020-03-06: 20 mg via INTRAVENOUS

## 2020-03-06 NOTE — Transfer of Care (Signed)
Immediate Anesthesia Transfer of Care Note  Patient: Linda Juarez  Procedure(s) Performed: COLONOSCOPY WITH PROPOFOL (N/A )  Patient Location: PACU  Anesthesia Type:General  Level of Consciousness: sedated  Airway & Oxygen Therapy: Patient Spontanous Breathing and Patient connected to nasal cannula oxygen  Post-op Assessment: Report given to RN and Post -op Vital signs reviewed and stable  Post vital signs: Reviewed and stable  Last Vitals:  Vitals Value Taken Time  BP 92/51 03/06/20 1156  Temp    Pulse 65 03/06/20 1158  Resp 17 03/06/20 1158  SpO2 98 % 03/06/20 1158  Vitals shown include unvalidated device data.  Last Pain:  Vitals:   03/06/20 1155  TempSrc:   PainSc: (P) Asleep         Complications: No complications documented.

## 2020-03-06 NOTE — Op Note (Signed)
[facility] Gastroenterology Patient Name: Linda Juarez Procedure Date: 03/06/2020 10:33 AM MRN: [id number] Account #: [id number] Date of Birth: Nov 01, 1931 Admit Type: Outpatient Age: 86 Room: [facility] ENDO ROOM 3 Gender: Female Note Status: Finalized Procedure:             Colonoscopy Indications:           Abnormal CT of the GI tract Providers:             Karalee Hauter B. Bonna Gains MD, MD Referring MD:          Elveria Rising. Damita Dunnings, MD (Referring MD) Medicines:             Monitored Anesthesia Care Complications:         No immediate complications. Procedure:             Pre-Anesthesia Assessment:                        - ASA Grade Assessment: II - A patient with mild                         systemic disease.                        - Prior to the procedure, a History and Physical was                         performed, and patient medications, allergies and                         sensitivities were reviewed. The patient's tolerance                         of previous anesthesia was reviewed.                        - The risks and benefits of the procedure and the                         sedation options and risks were discussed with the                         patient. All questions were answered and informed                         consent was obtained.                        - Patient identification and proposed procedure were                         verified prior to the procedure by the physician, the                         nurse, the anesthesiologist, the anesthetist and the                         technician. The procedure was verified in the  procedure room.                        After obtaining informed consent, the colonoscope was                         passed under direct vision. Throughout the procedure,                         the patient's blood pressure, pulse, and oxygen                         saturations were monitored  continuously. The                         Colonoscope was introduced through the anus and                         advanced to the the terminal ileum. The colonoscopy                         was performed with ease. The patient tolerated the                         procedure well. The quality of the bowel preparation                         was fair. Findings:      The perianal and digital rectal examinations were normal.      Three sessile polyps were found in the ascending colon and cecum. The       polyps were 4 to 5 mm in size. These polyps were removed with a jumbo       cold forceps. Resection and retrieval were complete.      Multiple diverticula were found in the sigmoid colon.      The exam was otherwise without abnormality.      The rectum, sigmoid colon, descending colon, transverse colon, ascending       colon, cecum and ileum appeared normal.      Non-bleeding internal hemorrhoids were found during retroflexion. Impression:            - Preparation of the colon was fair.                        - Three 4 to 5 mm polyps in the ascending colon and in                         the cecum, removed with a jumbo cold forceps. Resected                         and retrieved.                        - Diverticulosis in the sigmoid colon.                        - The examination was otherwise normal.                        -  The rectum, sigmoid colon, descending colon,                         transverse colon, ascending colon and cecum are normal.                        - Non-bleeding internal hemorrhoids. Recommendation:        - Discharge patient to home (with escort).                        - High fiber diet.                        - Advance diet as tolerated.                        - Continue present medications.                        - Await pathology results.                        - Repeat colonoscopy date to be determined after                         pending pathology results  are reviewed.                        - The findings and recommendations were discussed with                         the patient.                        - The findings and recommendations were discussed with                         the patient's family.                        - Return to primary care physician as previously                         scheduled. Procedure Code(s):     --- Professional ---                        [phone], Colonoscopy, flexible; with biopsy, single or                         multiple Diagnosis Code(s):     --- Professional ---                        K64.8, Other hemorrhoids                        K63.5, Polyp of colon                        K57.30, Diverticulosis of large intestine without  perforation or abscess without bleeding                        R93.3, Abnormal findings on diagnostic imaging of                         other parts of digestive tract CPT copyright 2019 American Medical Association. All rights reserved. The codes documented in this report are preliminary and upon coder review may  be revised to meet current compliance requirements.  Vonda Antigua, MD Margretta Sidle B. Bonna Gains MD, MD 03/06/2020 11:54:11 AM This report has been signed electronically. Number of Addenda: 0 Note Initiated On: 03/06/2020 10:33 AM Scope Withdrawal Time: 0 hours 22 minutes 13 seconds  Total Procedure Duration: 0 hours 35 minutes 5 seconds  Estimated Blood Loss:  Estimated blood loss: none.      [facility]

## 2020-03-06 NOTE — Anesthesia Preprocedure Evaluation (Addendum)
Anesthesia Evaluation  Patient identified by MRN, date of birth, ID band Patient awake    Reviewed: Allergy & Precautions, NPO status , Patient's Chart, lab work & pertinent test results  Airway Mallampati: II       Dental   Pulmonary neg sleep apnea, neg COPD, Not current smoker, former smoker,           Cardiovascular (-) hypertension(-) Past MI and (-) CHF (-) dysrhythmias (-) Valvular Problems/Murmurs     Neuro/Psych neg Seizures    GI/Hepatic Neg liver ROS, GERD  Medicated,  Endo/Other  neg diabetes  Renal/GU negative Renal ROS     Musculoskeletal   Abdominal   Peds  Hematology   Anesthesia Other Findings   Reproductive/Obstetrics                            Anesthesia Physical Anesthesia Plan  ASA: II  Anesthesia Plan: General   Post-op Pain Management:    Induction:   PONV Risk Score and Plan: 3 and Propofol infusion, TIVA and Treatment may vary due to age or medical condition  Airway Management Planned: Nasal Cannula  Additional Equipment:   Intra-op Plan:   Post-operative Plan:   Informed Consent: I have reviewed the patients History and Physical, chart, labs and discussed the procedure including the risks, benefits and alternatives for the proposed anesthesia with the patient or authorized representative who has indicated his/her understanding and acceptance.       Plan Discussed with:   Anesthesia Plan Comments:         Anesthesia Quick Evaluation

## 2020-03-06 NOTE — Anesthesia Procedure Notes (Signed)
Date/Time: 03/06/2020 11:15 AM Performed by: Allean Found, CRNA Pre-anesthesia Checklist: Patient identified, Emergency Drugs available, Suction available, Patient being monitored and Timeout performed Patient Re-evaluated:Patient Re-evaluated prior to induction Oxygen Delivery Method: Nasal cannula Placement Confirmation: positive ETCO2

## 2020-03-06 NOTE — Anesthesia Postprocedure Evaluation (Signed)
Anesthesia Post Note  Patient: Linda Juarez  Procedure(s) Performed: COLONOSCOPY WITH PROPOFOL (N/A )  Patient location during evaluation: Endoscopy Anesthesia Type: General Level of consciousness: awake and alert and oriented Pain management: pain level controlled Vital Signs Assessment: post-procedure vital signs reviewed and stable Respiratory status: spontaneous breathing Cardiovascular status: blood pressure returned to baseline Anesthetic complications: no   No complications documented.   Last Vitals:  Vitals:   03/06/20 1215 03/06/20 1225  BP: 140/78 140/75  Pulse: 62 (!) 57  Resp: 17 13  Temp:    SpO2: 99% 98%    Last Pain:  Vitals:   03/06/20 1225  TempSrc:   PainSc: 0-No pain                 Tyeesha Riker

## 2020-03-06 NOTE — H&P (Signed)
Vonda Antigua, MD [address] [place], [place], [place], [address] [address], [place], [place], [place], [address] Phone: [phone]  Fax: [phone]  Primary Care Physician:  Tonia Ghent, MD   Pre-Procedure History & Physical: HPI:  Linda Juarez is a 86 y.o. female is here for a colonoscopy.   Past Medical History:  Diagnosis Date  . 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 Arthritis    Osteoarthritis  . Diverticulitis   . Diverticulosis of colon without hemorrhage   . GERD (gastroesophageal reflux disease)   . HOH (hard of hearing)   . Hx of dysplastic nevus 09/30/2015   R med. dorsal foot  . Hyperlipidemia     Past Surgical History:  Procedure Laterality Date  . ABDOMINAL HYSTERECTOMY  1980's  . CATARACT EXTRACTION Left 2015  . CATARACT EXTRACTION W/PHACO Right 06/04/2015   Procedure: CATARACT EXTRACTION PHACO AND INTRAOCULAR LENS PLACEMENT ([place]);  Surgeon: Estill Cotta, MD;  Location: [facility] ORS;  Service: Ophthalmology;  Laterality: Right;  LOT# [id number] H [place]: 01:39.5 AP: 25.9% CDE: 42.63  . CATARACT EXTRACTION, BILATERAL    . CHOLECYSTECTOMY  2006  . JOINT REPLACEMENT Bilateral   . SKIN SURGERY  08/06/2017   Dr. Nehemiah Massed  . Stress Cardiolite  04/24/11   Normal perfusion study with no stress induced ischemia  . TONSILLECTOMY AND ADENOIDECTOMY    . TOTAL HIP ARTHROPLASTY Left 12/16/2016   Procedure: TOTAL HIP ARTHROPLASTY ANTERIOR APPROACH;  Surgeon: Hessie Knows, MD;  Location: [facility] ORS;  Service: Orthopedics;  Laterality: Left;  . TOTAL HIP ARTHROPLASTY Right 11/24/2017   Procedure: TOTAL HIP ARTHROPLASTY ANTERIOR APPROACH;  Surgeon: Hessie Knows, MD;  Location: [facility] ORS;  Service: Orthopedics;  Laterality: Right;    Prior to Admission medications   Medication Sig Start Date End Date Taking? Authorizing Provider  Garlic 397 MG CAPS Take 2,000 mg by mouth daily.     Yes [provider]  Multiple Vitamin (MULTIVITAMIN WITH MINERALS) TABS tablet Take 1 tablet by mouth daily. Centrum Silver   Yes [provider]  omeprazole (PRILOSEC) 40 MG capsule Take 1 capsule (40 mg total) by mouth daily. 12/16/19  Yes Bedsole, Amy E, MD  Probiotic Product (PROBIOTIC DAILY PO) Take 1 capsule by mouth daily with supper.    Yes [provider]  sodium chloride (OCEAN) 0.65 % SOLN nasal spray Place 1 spray into both nostrils 4 (four) times daily as needed for congestion.   Yes [provider]    Allergies as of 02/22/2020  . (No Known Allergies)    Family History  Problem Relation Age of Onset  . Stroke Mother   . COPD Sister   . Thyroid disease Sister   . Heart disease Brother   . Thyroid disease Other   . Colon cancer Neg Hx   . Colon polyps Neg Hx   . Diabetes Neg Hx   . Kidney disease Neg Hx   . Esophageal cancer Neg Hx   . Breast cancer Neg Hx     Social History   Socioeconomic History  . Marital status: Widowed    Spouse name: Not on file  . Number of children: 0  . Years of education: Not on file  . Highest education level: Not on file  Occupational History  . Occupation: Retired    Fish farm manager: UNEMPLOYED  Tobacco Use  . Smoking status: Former Smoker  Packs/day: 0.50    Types: Cigarettes    Quit date: 09/22/1986    Years since quitting: 33.4  . Smokeless tobacco: Never Used  Vaping Use  . Vaping Use: Never used  Substance and Sexual Activity  . Alcohol use: Yes    Comment: Occassionally-rare  . Drug use: No  . Sexual activity: Not on file  Other Topics Concern  . Not on file  Social History Narrative   Married 1977, widowed 2015- husband had cancer   2 step children, 4 step grand kids   Retired   Therapist, art in independent community as of 2021   Social Determinants of Health   Financial Resource Strain: [place]   . Difficulty of Paying Living Expenses: Not hard at all  Food Insecurity: No Food  Insecurity  . Worried About Charity fundraiser in the Last Year: Never true  . Ran Out of Food in the Last Year: Never true  Transportation Needs: No Transportation Needs  . Lack of Transportation (Medical): No  . Lack of Transportation (Non-Medical): No  Physical Activity: Inactive  . Days of Exercise per Week: 0 days  . Minutes of Exercise per Session: 0 min  Stress: No Stress Concern Present  . Feeling of Stress : Not at all  Social Connections:   . Frequency of Communication with Friends and Family:   . Frequency of Social Gatherings with Friends and Family:   . Attends Religious Services:   . Active Member of Clubs or Organizations:   . Attends Archivist Meetings:   Marland Kitchen Marital Status:   Intimate Partner Violence: Not At Risk  . Fear of Current or Ex-Partner: No  . Emotionally Abused: No  . Physically Abused: No  . Sexually Abused: No    Review of Systems: See HPI, otherwise negative ROS  Physical Exam: BP 140/71   Juarez (!) 56   Temp (!) 97 F (36.1 C) (Temporal)   Resp 15   Ht 5\' 6"  (1.676 m)   Wt 64.9 kg   SpO2 97%   BMI 23.08 kg/m  General:   Alert,  pleasant and cooperative in NAD Head:  Normocephalic and atraumatic. Neck:  Supple; no masses or thyromegaly. Lungs:  Clear throughout to auscultation, normal respiratory effort.    Heart:  +S1, +S2, Regular rate and rhythm, No edema. Abdomen:  Soft, nontender and nondistended. Normal bowel sounds, without guarding, and without rebound.   Neurologic:  Alert and  oriented x4;  grossly normal neurologically.  Impression/Plan: Linda Juarez is here for a colonoscopy to be performed for abnormal CT .  Risks, benefits, limitations, and alternatives regarding  colonoscopy have been reviewed with the patient.  Questions have been answered.  All parties agreeable.   Virgel Manifold, MD  03/06/2020, 10:39 AM

## 2020-03-07 ENCOUNTER — Ambulatory Visit: Payer: Medicare PPO | Admitting: Gastroenterology

## 2020-03-07 ENCOUNTER — Encounter: Payer: Self-pay | Admitting: Gastroenterology

## 2020-03-07 LAB — SURGICAL PATHOLOGY

## 2020-03-09 ENCOUNTER — Encounter: Payer: Self-pay | Admitting: Gastroenterology

## 2020-03-28 ENCOUNTER — Ambulatory Visit: Payer: Medicare PPO | Admitting: Gastroenterology

## 2020-05-27 IMAGING — CT CT ABD-PELV W/ CM
2 of 5 series · 14 of 46 positions shown, 16 images · IV contrast (omnipaque)
Comparison: Abdominal CT 05/31/2014

CLINICAL DATA: Abdominal pain and diarrhea.  Diarrhea for 8 weeks.

EXAM:
CT ABDOMEN AND PELVIS WITH CONTRAST
TECHNIQUE: Multidetector CT imaging of the abdomen and pelvis was performed
using the standard protocol following bolus administration of
intravenous contrast.
CONTRAST:  85mL OMNIPAQUE IOHEXOL 300 MG/ML  SOLN

[Series 2: abd pelvis (person_name) 5.00 · axial · 0.65mm/px · z∈[-1527,-1142]mm · 11 of 87 slices shown, 13 images]
[im 5/87  soft-tissue]
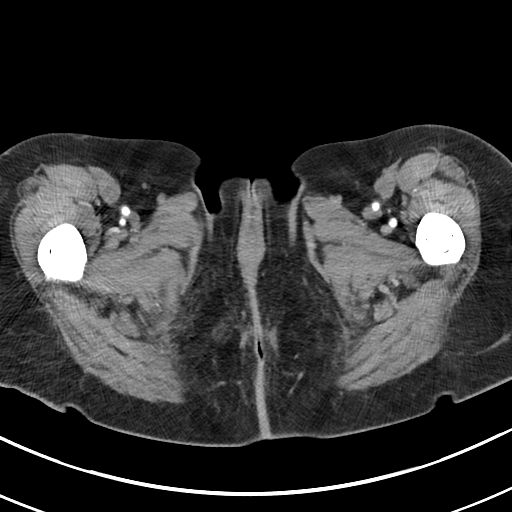
[im 5/87  bone]
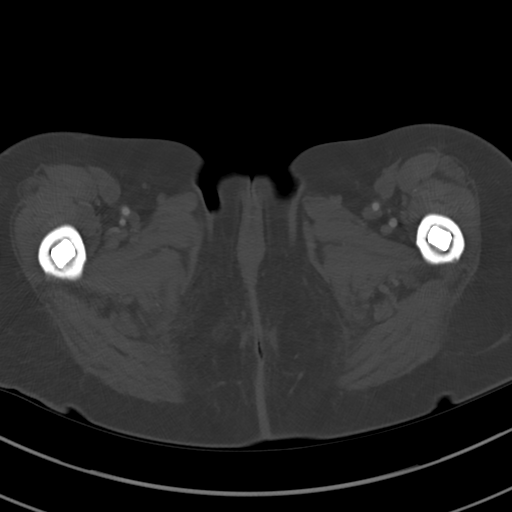
[im 15/87  soft-tissue]
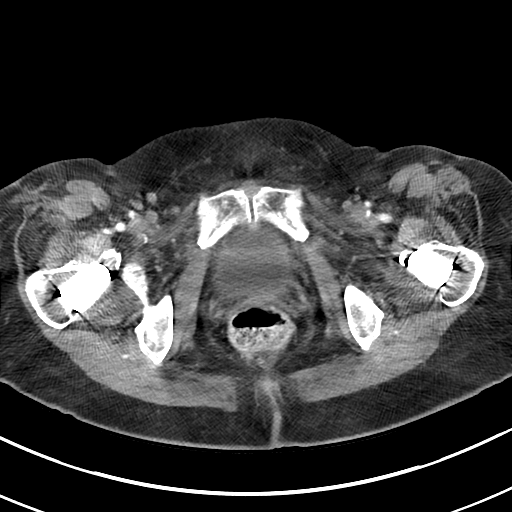
[im 20/87  soft-tissue]
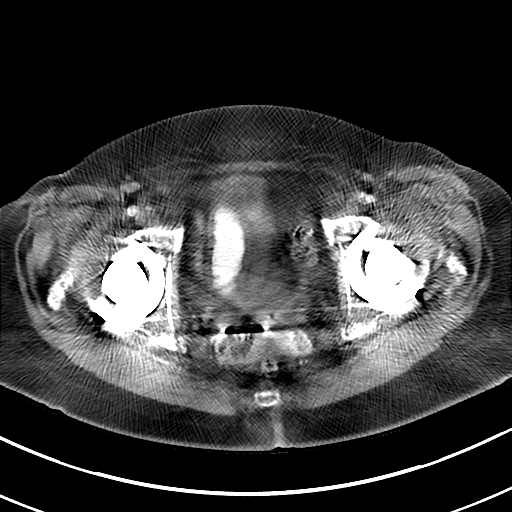
[im 29/87  soft-tissue]
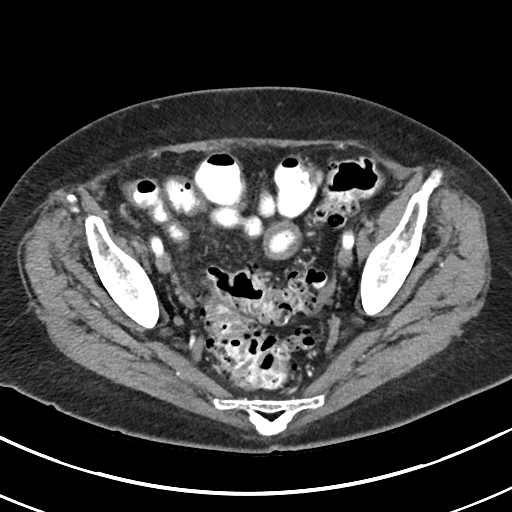
[im 34/87  soft-tissue]
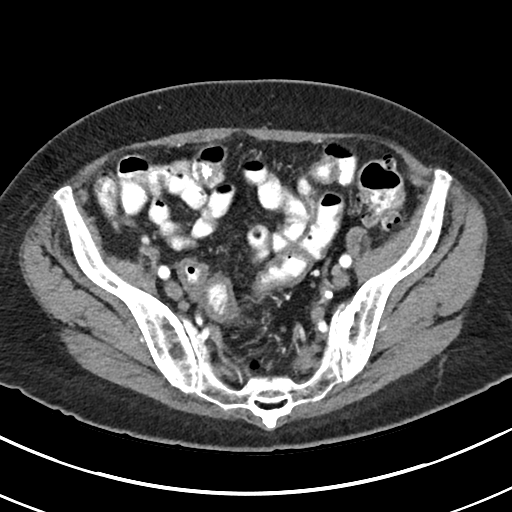
[im 44/87  soft-tissue]
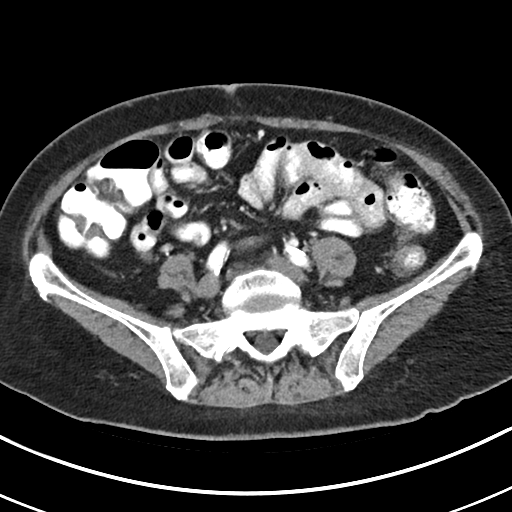
[im 53/87  soft-tissue]
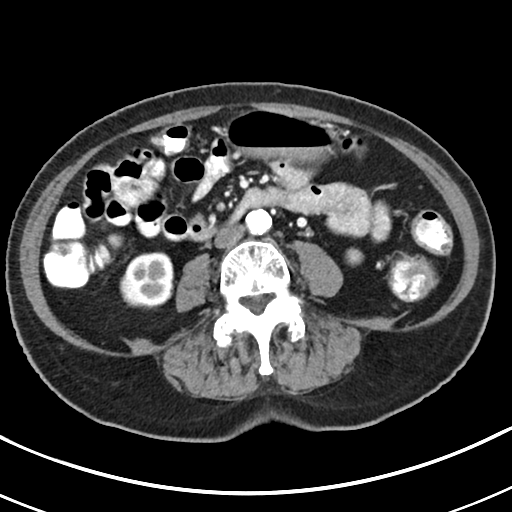
[im 58/87  soft-tissue]
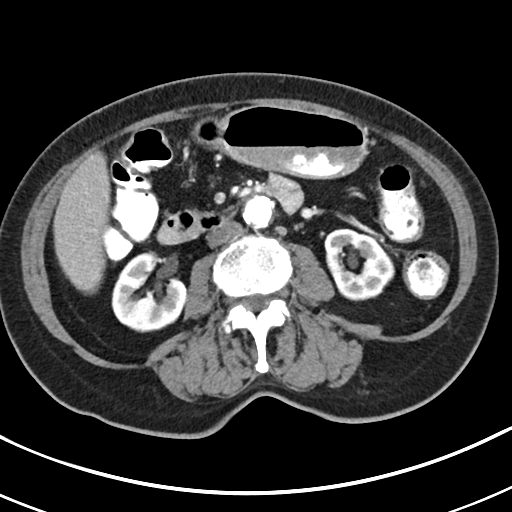
[im 67/87  soft-tissue]
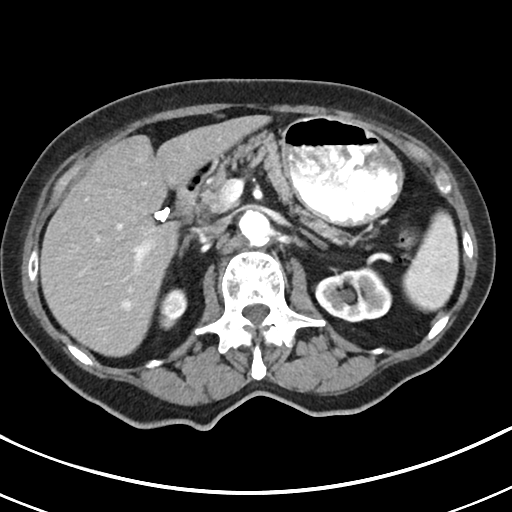
[im 67/87  bone]
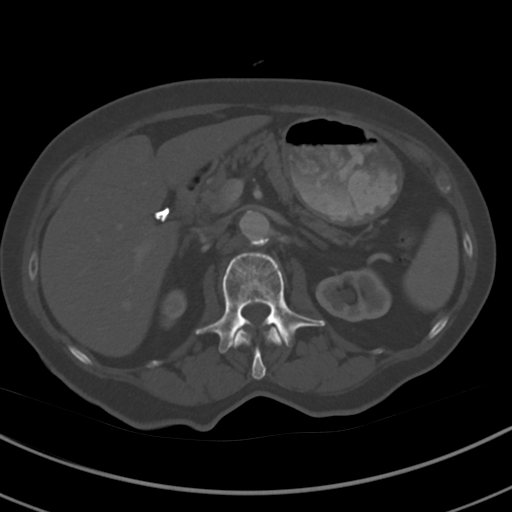
[im 72/87  soft-tissue]
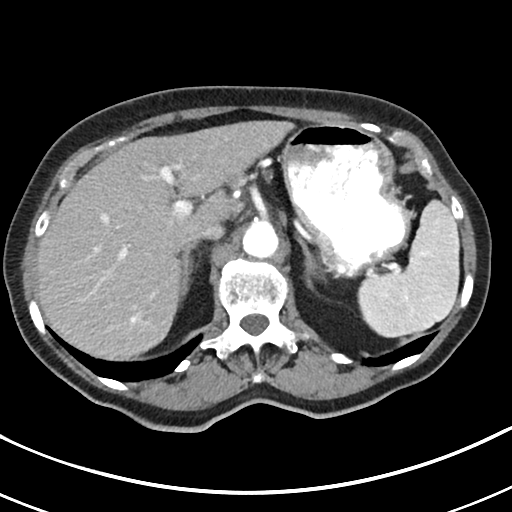
[im 82/87  soft-tissue]
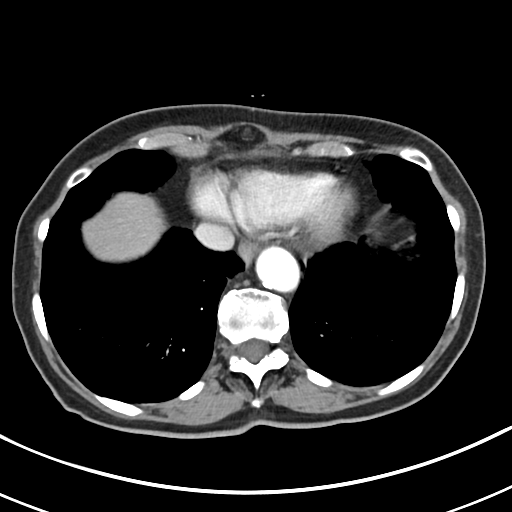

[Series 4: coronals abd pelvis (person_name) 2.00 cor · coronal · 0.65mm/px · 3 of 163 slices shown]
[im 55/163  soft-tissue]
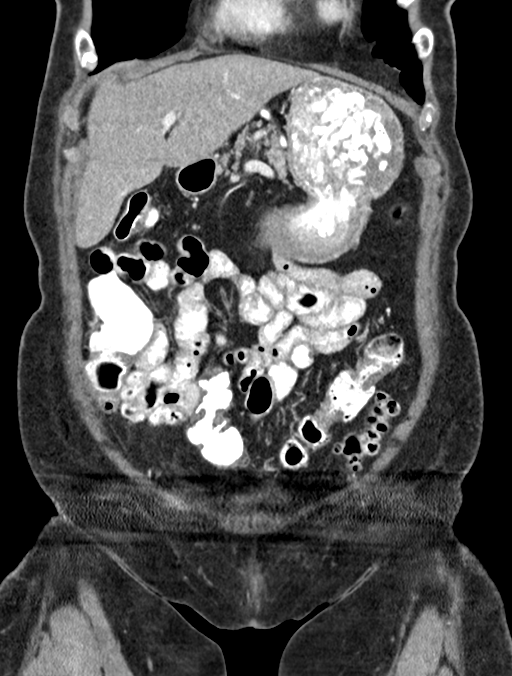
[im 73/163  soft-tissue]
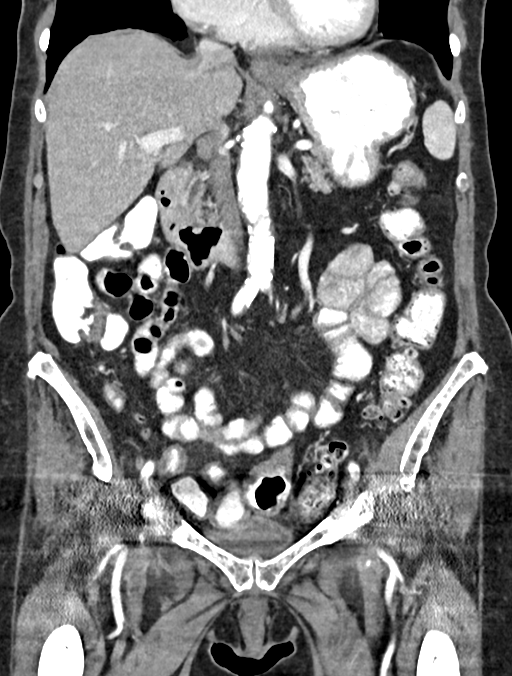
[im 91/163  soft-tissue]
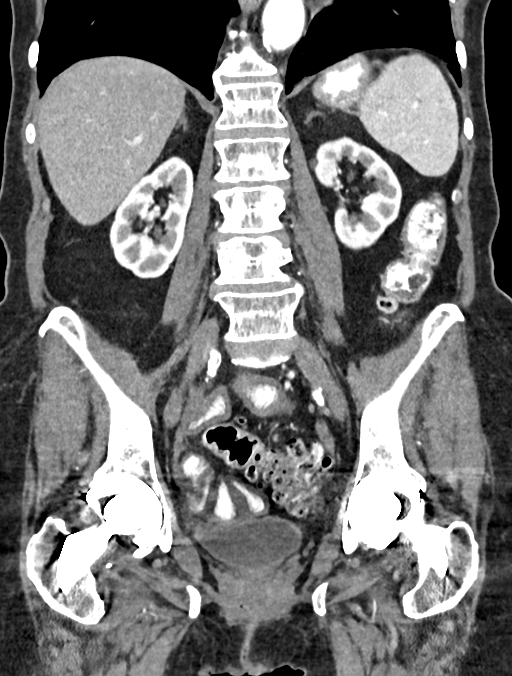

[14 of 46 positions shown; findings below may reference images not displayed]

FINDINGS: Lower chest: Hypoventilatory changes at the lung bases. No basilar
consolidation or nodule. Included heart appears normal in size. No
pleural fluid.

Hepatobiliary: Mild decreased hepatic density consistent with
steatosis. There are no focal hepatic lesions. Clips in the
gallbladder fossa postcholecystectomy. No biliary dilatation.

Pancreas: Parenchymal atrophy. No ductal dilatation or inflammation.

Spleen: Normal in size without focal abnormality.

Adrenals/Urinary Tract: Normal adrenal glands. No hydronephrosis or
perinephric edema. Homogeneous renal enhancement with symmetric
excretion on delayed phase imaging. 17 mm cyst in the posterior left
kidney. Additional tiny hypodensity in the upper left kidney is too
small to accurately characterize. No evidence of solid lesion.
Urinary bladder is physiologically distended without wall
thickening.

Stomach/Bowel: Ingested material distends the stomach. There is no
gastric wall thickening. Normal positioning of the ligament of
Treitz. Moderate length segment of wall thickening involving the
distal ileum in the lower abdomen and pelvis. Wall thickening is
slightly less prominent than on prior exam, the distribution is
similar. The terminal ileum itself is normal. The appendix is
normal. There is no obstruction, administered contrast reaches the
colon. Transverse colon is tortuous. There is diverticulosis from
the splenic flexure distally, prominent in the sigmoid colon.
Questionable mild mural hypertrophy of the sigmoid colon. Portions
of the sigmoid are obscured by streak artifact from bilateral hip
arthroplasties, despite metal reduction technique was employed.

Vascular/Lymphatic: Aorto bi-iliac atherosclerosis. Ectatic
infrarenal aorta at 2.7 cm. The portal vein is patent. No abdominal
adenopathy. No evidence of pelvic adenopathy, evaluation slightly
limited by streak artifact.

Reproductive: Uterus is surgically absent. No evidence of adnexal
mass.

Other: Small fat containing umbilical hernia. No ascites or free
fluid. Improved mesenteric edema in the region of small-bowel
abnormality from prior. No focal fluid collection. No free air.

Musculoskeletal: Bilateral hip arthroplasties. Anterolisthesis of L4
on L5 is likely facet mediated. No acute osseous abnormality or
focal bone lesion.
IMPRESSION: 1. Moderate length segment of wall thickening involving the distal
ileum in the lower abdomen and pelvis. This is similar in
distribution to 8001 abdominal CT, and actually less inflamed than
on previous. Findings may represent enteritis, infectious or
inflammatory. Neoplastic involvement is felt less likely given the
moderate length of involvement.
2. Colonic diverticulosis from the splenic flexure distally.
Questionable mild mural hypertrophy of the sigmoid colon, however
this may be secondary to nondistention. Sigmoid colonic evaluation
is partially limited due to streak artifact from adjacent hip
arthroplasties.
3. No significant liquid stool to suggest diarrheal process.
4. Mild hepatic steatosis.

Aortic Atherosclerosis ([id number]).

## 2020-09-10 DIAGNOSIS — H26492 Other secondary cataract, left eye: Secondary | ICD-10-CM | POA: Diagnosis not present

## 2020-10-10 ENCOUNTER — Other Ambulatory Visit: Payer: Self-pay | Admitting: Family Medicine

## 2020-10-10 DIAGNOSIS — Z96649 Presence of unspecified artificial hip joint: Secondary | ICD-10-CM

## 2020-10-10 DIAGNOSIS — E785 Hyperlipidemia, unspecified: Secondary | ICD-10-CM

## 2020-10-23 ENCOUNTER — Other Ambulatory Visit: Payer: Self-pay

## 2020-10-23 ENCOUNTER — Other Ambulatory Visit (INDEPENDENT_AMBULATORY_CARE_PROVIDER_SITE_OTHER): Payer: Medicare PPO

## 2020-10-23 DIAGNOSIS — Z96649 Presence of unspecified artificial hip joint: Secondary | ICD-10-CM

## 2020-10-23 DIAGNOSIS — E785 Hyperlipidemia, unspecified: Secondary | ICD-10-CM

## 2020-10-23 LAB — COMPREHENSIVE METABOLIC PANEL
ALT: 23 U/L (ref 0–35)
AST: 28 U/L (ref 0–37)
Albumin: 4.1 g/dL (ref 3.5–5.2)
Alkaline Phosphatase: 81 U/L (ref 39–117)
BUN: 20 mg/dL (ref 6–23)
CO2: 28 mEq/L (ref 19–32)
Calcium: 9.6 mg/dL (ref 8.4–10.5)
Chloride: 106 mEq/L (ref 96–112)
Creatinine, Ser: 0.97 mg/dL (ref 0.40–1.20)
GFR: 52.87 mL/min — ABNORMAL LOW (ref >60.00)
Glucose, Bld: 84 mg/dL (ref 70–99)
Potassium: 4.3 mEq/L (ref 3.5–5.1)
Sodium: 139 mEq/L (ref 135–145)
Total Bilirubin: 0.5 mg/dL (ref 0.2–1.2)
Total Protein: 6.9 g/dL (ref 6.0–8.3)

## 2020-10-23 LAB — LIPID PANEL
Cholesterol: 210 mg/dL — ABNORMAL HIGH (ref 0–200)
HDL: 49.1 mg/dL (ref >39.00)
LDL Cholesterol: 137 mg/dL — ABNORMAL HIGH (ref 0–99)
NonHDL: 161.26
Total CHOL/HDL Ratio: 4
Triglycerides: 123 mg/dL (ref 0.0–149.0)
VLDL: 24.6 mg/dL (ref 0.0–40.0)

## 2020-10-23 LAB — VITAMIN D 25 HYDROXY (VIT D DEFICIENCY, FRACTURES): VITD: 55.96 ng/mL (ref 30.00–100.00)

## 2020-10-30 ENCOUNTER — Other Ambulatory Visit: Payer: Self-pay

## 2020-10-30 ENCOUNTER — Encounter: Payer: Self-pay | Admitting: Family Medicine

## 2020-10-30 ENCOUNTER — Ambulatory Visit (INDEPENDENT_AMBULATORY_CARE_PROVIDER_SITE_OTHER): Payer: Medicare PPO | Admitting: Family Medicine

## 2020-10-30 VITALS — BP 120/78 | HR 61 | Temp 97.6°F | Ht 66.0 in | Wt 147.0 lb

## 2020-10-30 DIAGNOSIS — R197 Diarrhea, unspecified: Secondary | ICD-10-CM

## 2020-10-30 DIAGNOSIS — R059 Cough, unspecified: Secondary | ICD-10-CM

## 2020-10-30 DIAGNOSIS — Z0000 Encounter for general adult medical examination without abnormal findings: Secondary | ICD-10-CM

## 2020-10-30 DIAGNOSIS — Z23 Encounter for immunization: Secondary | ICD-10-CM | POA: Diagnosis not present

## 2020-10-30 DIAGNOSIS — Z7189 Other specified counseling: Secondary | ICD-10-CM

## 2020-10-30 MED ORDER — GARLIC 500 MG PO CAPS: 500.0000 mg | ORAL_CAPSULE | Freq: Every day | ORAL | Status: DC

## 2020-10-30 MED ORDER — OMEPRAZOLE 40 MG PO CPDR: 40.0000 mg | DELAYED_RELEASE_CAPSULE | Freq: Every day | ORAL | 3 refills | Status: DC

## 2020-10-30 NOTE — Progress Notes (Signed)
This visit occurred during the SARS-CoV-2 public health emergency.  Safety protocols were in place, including screening questions prior to the visit, additional usage of staff PPE, and extensive cleaning of exam room while observing appropriate contact time as indicated for disinfecting solutions.  I have personally reviewed the Medicare Annual Wellness questionnaire and have noted 1. The patient's medical and social history 2. Their use of alcohol, tobacco or illicit drugs 3. Their current medications and supplements 4. The patient's functional ability including ADL's, fall risks, home safety risks and hearing or visual             impairment. 5. Diet and physical activities 6. Evidence for depression or mood disorders  The patients weight, height, BMI have been recorded in the chart and visual acuity is per eye clinic.  I have made referrals, counseling and provided education to the patient based review of the above and I have provided the pt with a written personalized care plan for preventive services.  Provider list updated- see scanned forms.  Routine anticipatory guidance given to patient.  See health maintenance. The possibility exists that previously documented standard health maintenance information may have been brought forward from a previous encounter into this note.  If needed, that same information has been updated to reflect the current situation based on today's encounter.    Flu today Shingles discussed with patient, previously had Zostavax. PNA up-to-date Tetanus 2014 Covid vaccine up-to-date. Colonoscopy 2021 Breast cancer screening discussed with patient.  She will consider. Bone density test discussed with patient.  She can consider Advance directive d/w pt- nephew Jonn Shingles designated if patient were incapacitated.  Cognitive function addressed- see scanned forms- and if abnormal then additional documentation follows.   Labs discussed with patient.  GI sx  resolved with lactose avoidance.  Discussed.  She had occ cough.  Unclear if GERD related.  Off PPI recently.  No fevers or chills.  No sputum.  She does not feel sick otherwise.  PMH and SH reviewed  Meds, vitals, and allergies reviewed.   ROS: Per HPI.  Unless specifically indicated otherwise in HPI, the patient denies:  General: fever. Eyes: acute vision changes ENT: sore throat Cardiovascular: chest pain Respiratory: SOB GI: vomiting GU: dysuria Musculoskeletal: acute back pain Derm: acute rash Neuro: acute motor dysfunction Psych: worsening mood Endocrine: polydipsia Heme: bleeding Allergy: hayfever  GEN: nad, alert and oriented HEENT: ncat NECK: supple w/o LA, she has clavicle asymmetry at baseline.   CV: rrr. PULM: ctab, no inc wob ABD: soft, +bs EXT: no edema SKIN: no acute rash

## 2020-10-30 NOTE — Patient Instructions (Addendum)
Try restarting omeprazole and see if that helps with the throat clearing.  Take care.  Glad to see you. If you have other troubles then let me know.

## 2020-10-31 DIAGNOSIS — R059 Cough, unspecified: Secondary | ICD-10-CM | POA: Insufficient documentation

## 2020-10-31 NOTE — Assessment & Plan Note (Signed)
Lungs are clear.  Could be GERD related.  She can restart Prilosec and update me as needed.  She agrees.

## 2020-10-31 NOTE — Assessment & Plan Note (Signed)
Flu today Shingles discussed with patient, previously had Zostavax. PNA up-to-date Tetanus 2014 Covid vaccine up-to-date. Colonoscopy 2021 Breast cancer screening discussed with patient.  She will consider. Bone density test discussed with patient.  She can consider Advance directive d/w pt- nephew Jonn Shingles designated if patient were incapacitated.  Cognitive function addressed- see scanned forms- and if abnormal then additional documentation follows.

## 2020-10-31 NOTE — Assessment & Plan Note (Signed)
Advance directive d/w pt- nephew Jonn Shingles designated if patient were incapacitated.

## 2020-10-31 NOTE — Assessment & Plan Note (Signed)
Resolved with lactose avoidance.

## 2020-11-26 DIAGNOSIS — H26492 Other secondary cataract, left eye: Secondary | ICD-10-CM | POA: Diagnosis not present

## 2021-06-03 ENCOUNTER — Telehealth: Payer: Self-pay

## 2021-06-03 NOTE — Telephone Encounter (Signed)
I would prefer to see her sooner rather than wait until return to [facility].  Please check with patient tomorrow.  Thanks.

## 2021-06-03 NOTE — Telephone Encounter (Signed)
[facility] - Client TELEPHONE ADVICE RECORD [facility] Patient Name: Linda Juarez [id number] Gender: Female DOB: 1931-12-24 Age: 87 Y 2 M 26 D Return Phone Number: [phone] (Primary), [phone] (Secondary) Address[place] [address] Client [facility] - Client Client Site [place] Physician Renford Dills - MD Contact Type Call Who Is Calling Patient / Member / Family / Caregiver Call Type Triage / Clinical Relationship To Patient Self Return Phone Number [phone] (Primary) Chief Complaint Fatigue (greater than THREE MONTHS old) Reason for Call Symptomatic / Request for [place] states they are having extreme fatigue. Translation No No Triage Reason Patient declined Nurse Assessment Nurse: Valentino Nose, RN, [place] Date/Time Eilene Ghazi Time): 06/03/2021 11:45:11 AM Confirm and document reason for call. If symptomatic, describe symptoms. ---Caller states she was calling to talk about a few things with her MD but she states she will wait until her upcoming appt. States she is about to leave for lunch and declines triage. Does the patient have any new or worsening symptoms? ---Yes Will a triage be completed? ---No Select reason for no triage. ---Patient declined Please document clinical information provided and list any resource used. ---Nurse advised caller that she can call back if she has any other questions or concerns. Disp. Time Eilene Ghazi Time) Disposition Final User 06/03/2021 11:20:59 AM Attempt made - no message left Valentino Nose, RN, [place] 06/03/2021 11:50:40 AM Clinical Call Yes Valentino Nose, [place], [place]

## 2021-06-03 NOTE — Telephone Encounter (Signed)
I spoke with pt; recently some things have bothered her: pt said she continues with fatigue and pt having cramping in legs and feet at night; pt has nose bleeds often, last nose bleed on 06/01/21 or 06/02/2021; pt has been using saline solution in the morning and in the evening which seems to help some. In last 2 wks pt has had 2 nose bleeds; pt applies ice to nose and that stops nose bleed; at end of nose bleed has mucus plug come out of nose. This has been going on and off for 3 months. Pt is not having H/A,Dizziness,CP,SOB or vision changes. I offered pt an appt at [facility] but pt said she would rather stick with Dr Damita Dunnings and pt does not drive in [place] so pt plans to wait until Dr Josefine Class return to [facility]. UC & ED precautions given and pt voiced understanding. Also advised pt if she changed her mind she could call for appt at [facility] also. Pt appreciative and said she thought she would be OK until our return to [facility]. Sending note to Dr Damita Dunnings for review and Janett Billow CMA. Pt did not give me a pharmacy because she does not think Dr Damita Dunnings will call med in so if need local pharmacy will need to ck with pt.

## 2021-06-04 ENCOUNTER — Ambulatory Visit: Payer: Medicare PPO | Admitting: Family Medicine

## 2021-06-06 DIAGNOSIS — H9202 Otalgia, left ear: Secondary | ICD-10-CM | POA: Diagnosis not present

## 2021-06-06 DIAGNOSIS — T162XXA Foreign body in left ear, initial encounter: Secondary | ICD-10-CM | POA: Diagnosis not present

## 2021-06-06 DIAGNOSIS — H903 Sensorineural hearing loss, bilateral: Secondary | ICD-10-CM | POA: Diagnosis not present

## 2021-06-06 DIAGNOSIS — H6122 Impacted cerumen, left ear: Secondary | ICD-10-CM | POA: Diagnosis not present

## 2021-06-07 NOTE — Telephone Encounter (Signed)
LMTCB

## 2021-06-07 NOTE — Telephone Encounter (Signed)
Spoke with patient and discussed her coming into the office if at all possible before we go back to [facility]. Patient states she can try her best to find the office or maybe get someone to bring her but not until the end of the month. I scheduled her for 06/20/21 at 2:30 pm. Patient will call back if this really will not work for her.

## 2021-06-09 NOTE — Telephone Encounter (Signed)
Noted. Thanks.

## 2021-06-19 DIAGNOSIS — Z961 Presence of intraocular lens: Secondary | ICD-10-CM | POA: Diagnosis not present

## 2021-06-20 ENCOUNTER — Other Ambulatory Visit: Payer: Self-pay

## 2021-06-20 ENCOUNTER — Ambulatory Visit (INDEPENDENT_AMBULATORY_CARE_PROVIDER_SITE_OTHER): Payer: Medicare Other | Admitting: Family Medicine

## 2021-06-20 ENCOUNTER — Encounter: Payer: Self-pay | Admitting: Family Medicine

## 2021-06-20 VITALS — BP 140/80 | HR 82 | Temp 97.9°F | Ht 66.0 in | Wt 154.0 lb

## 2021-06-20 DIAGNOSIS — R252 Cramp and spasm: Secondary | ICD-10-CM

## 2021-06-20 DIAGNOSIS — R040 Epistaxis: Secondary | ICD-10-CM

## 2021-06-20 DIAGNOSIS — R3915 Urgency of urination: Secondary | ICD-10-CM | POA: Diagnosis not present

## 2021-06-20 LAB — CBC WITH DIFFERENTIAL/PLATELET
Basophils Absolute: 0 10*3/uL (ref 0.0–0.1)
Basophils Relative: 0.5 % (ref 0.0–3.0)
Eosinophils Absolute: 0.1 10*3/uL (ref 0.0–0.7)
Eosinophils Relative: 2.1 % (ref 0.0–5.0)
HCT: 41.4 % (ref 36.0–46.0)
Hemoglobin: 13.7 g/dL (ref 12.0–15.0)
Lymphocytes Relative: 29.5 % (ref 12.0–46.0)
Lymphs Abs: 1.6 10*3/uL (ref 0.7–4.0)
MCHC: 33.2 g/dL (ref 30.0–36.0)
MCV: 90.9 fl (ref 78.0–100.0)
Monocytes Absolute: 0.4 10*3/uL (ref 0.1–1.0)
Monocytes Relative: 7.3 % (ref 3.0–12.0)
Neutro Abs: 3.4 10*3/uL (ref 1.4–7.7)
Neutrophils Relative %: 60.6 % (ref 43.0–77.0)
Platelets: 195 10*3/uL (ref 150.0–400.0)
RBC: 4.56 Mil/uL (ref 3.87–5.11)
RDW: 13.8 % (ref 11.5–15.5)
WBC: 5.6 10*3/uL (ref 4.0–10.5)

## 2021-06-20 LAB — BASIC METABOLIC PANEL
BUN: 20 mg/dL (ref 6–23)
CO2: 31 mEq/L (ref 19–32)
Calcium: 9.3 mg/dL (ref 8.4–10.5)
Chloride: 103 mEq/L (ref 96–112)
Creatinine, Ser: 1.12 mg/dL (ref 0.40–1.20)
GFR: 44.28 mL/min — ABNORMAL LOW (ref >60.00)
Glucose, Bld: 143 mg/dL — ABNORMAL HIGH (ref 70–99)
Potassium: 4.1 mEq/L (ref 3.5–5.1)
Sodium: 141 mEq/L (ref 135–145)

## 2021-06-20 NOTE — Patient Instructions (Signed)
Don't change your meds for now. Go to the lab on the way out.   If you have mychart we'll likely use that to update you.    Take care.  Glad to see you. 

## 2021-06-20 NOTE — Progress Notes (Signed)
This visit occurred during the SARS-CoV-2 public health emergency.  Safety protocols were in place, including screening questions prior to the visit, additional usage of staff PPE, and extensive cleaning of exam room while observing appropriate contact time as indicated for disinfecting solutions.  We talked about UTI sx.  She doesn't have sx currently.  She does Kegel exercises but still has some SUI and she wears a pad.  Discussed options.  She has some leg cramping at night, tx'd with mustard or vinegar.  Not exertional.  No CP.  She has changes with "tightness" at the balls of the feet, better with walking.    Epistaxis.  Increased frequency recently.  Bleeding w/o trauma or pain.  Tx'd with compression and ice.  Usually L nostril per patient recollection.  Using nasal saline BID and no bleed in the last few days.  No FCNAVD.  No ST or ear pain.  Not SOB, not wheeze.  No bleeding from the gums, no blood in urine or stool.    Vaccines d/w pt.  She'll get flu shot out of clinic next month.   She may need transportation help with future visits.  I asked her to update me as needed.   She agreed.  Meds, vitals, and allergies reviewed.   ROS: Per HPI unless specifically indicated in ROS section   GEN: nad, alert and oriented HEENT: TM wnl, nasal and OP exam wnl, no nasal lesions noted.  No adherent clot or bloody discharge. NECK: supple w/o LA CV: rrr.   PULM: ctab, no inc wob ABD: soft, +bs EXT: no edema SKIN: Well-perfused. Normal dorsalis pedis and posterior tibial pulses bilaterally.  30 minutes were devoted to patient care in this encounter (this includes time spent reviewing the patient's file/history, interviewing and examining the patient, counseling/reviewing plan with patient).

## 2021-06-23 DIAGNOSIS — R040 Epistaxis: Secondary | ICD-10-CM | POA: Insufficient documentation

## 2021-06-23 NOTE — Assessment & Plan Note (Signed)
Noted at night.  Not exertional.  Not on a statin.  See notes on labs.  I asked her to increase her hydration and monitor her symptoms and update me as needed.

## 2021-06-23 NOTE — Assessment & Plan Note (Signed)
Benign exam.  No other bleeding.  Check CBC.  See notes on labs.

## 2021-06-23 NOTE — Assessment & Plan Note (Signed)
We can refer to urogynecology in the future if needed.  She can update me as needed.

## 2021-10-10 DIAGNOSIS — H903 Sensorineural hearing loss, bilateral: Secondary | ICD-10-CM | POA: Diagnosis not present

## 2021-10-10 DIAGNOSIS — H6121 Impacted cerumen, right ear: Secondary | ICD-10-CM | POA: Diagnosis not present

## 2021-10-10 DIAGNOSIS — H9121 Sudden idiopathic hearing loss, right ear: Secondary | ICD-10-CM | POA: Diagnosis not present

## 2021-10-25 ENCOUNTER — Telehealth (INDEPENDENT_AMBULATORY_CARE_PROVIDER_SITE_OTHER): Payer: Medicare Other | Admitting: Family

## 2021-10-25 ENCOUNTER — Encounter: Payer: Self-pay | Admitting: Family

## 2021-10-25 ENCOUNTER — Other Ambulatory Visit: Payer: Self-pay

## 2021-10-25 VITALS — Temp 98.6°F | Ht 66.0 in | Wt 154.0 lb

## 2021-10-25 DIAGNOSIS — J069 Acute upper respiratory infection, unspecified: Secondary | ICD-10-CM | POA: Insufficient documentation

## 2021-10-25 NOTE — Progress Notes (Signed)
Virtual telephone visit    Virtual Visit via Telephone Note   This visit type was conducted due to national recommendations for restrictions regarding the COVID-19 Pandemic (e.g. social distancing) in an effort to limit this patient's exposure and mitigate transmission in our community. Due to her co-morbid illnesses, this patient is at least at moderate risk for complications without adequate follow up. This format is felt to be most appropriate for this patient at this time. The patient did not have access to video technology or had technical difficulties with video requiring transitioning to audio format only (telephone). Physical exam was limited to content and character of the telephone converstion. CMA was able to get the patient set up on a telephone visit.   Patient location: Home. Patient and provider in visit Provider location: Office  I discussed the limitations of evaluation and management by telemedicine and the availability of in person appointments. The patient expressed understanding and agreed to proceed.   Visit Date: 10/25/2021  Today's healthcare provider: Eugenia Pancoast, FNP     Subjective:    Patient ID: Linda Juarez, female    DOB: Aug 16, 1932, 88 y.o.   MRN: [id number]  Chief Complaint  Patient presents with   Nasal Congestion   Cough    Cough Pertinent negatives include no chest pain, chills, ear pain, fever or sore throat.   88 y/o female here via telephone visit for concerns.  She lives in independent living in the senior apartments.   Sx have started two days ago.  No sore throat, no ear pain. Rhinorrhea and feeling fatigued. She states she just feels more run down than normal. She isn't coughing at all. No chest congestion or chest tightness. Not sneezing at all.   She saw her ENT two weeks ago and was placed on prednisone for 12 days which she has since completed, she is unsure why she was placed on prednisone .  She did have her tooth  removed last week 10/17/21 as well. She states she doesn't see any sign of infection where tooth was pulled, no pain at current in that area either.  She also denies urinary urgency, urinary frequency, and or dysuria.   Past Medical History:  Diagnosis Date   Acquired clavicle deformity    medial portion of R clavicle with enlargement- prev neg w/u per patient report   Arthritis    Osteoarthritis   Diverticulitis    Diverticulosis of colon without hemorrhage    GERD (gastroesophageal reflux disease)    HOH (hard of hearing)    Hx of dysplastic nevus 09/30/2015   R med. dorsal foot   Hyperlipidemia     Past Surgical History:  Procedure Laterality Date   ABDOMINAL HYSTERECTOMY  1980's   CATARACT EXTRACTION Left 2015   CATARACT EXTRACTION W/PHACO Right 06/04/2015   Procedure: CATARACT EXTRACTION PHACO AND INTRAOCULAR LENS PLACEMENT (IOC);  Surgeon: Estill Cotta, MD;  Location: [facility] ORS;  Service: Ophthalmology;  Laterality: Right;  LOT# [id number] H [place]: 01:39.5 AP: 25.9% CDE: 42.63   CATARACT EXTRACTION, BILATERAL     CHOLECYSTECTOMY  2006   COLONOSCOPY WITH PROPOFOL N/A 03/06/2020   Procedure: COLONOSCOPY WITH PROPOFOL;  Surgeon: Virgel Manifold, MD;  Location: [facility] ENDOSCOPY;  Service: Endoscopy;  Laterality: N/A;   JOINT REPLACEMENT Bilateral    SKIN SURGERY  08/06/2017   Dr. Nehemiah Massed   Stress Cardiolite  04/24/11   Normal perfusion study with no stress induced ischemia   TONSILLECTOMY AND ADENOIDECTOMY  TOTAL HIP ARTHROPLASTY Left 12/16/2016   Procedure: TOTAL HIP ARTHROPLASTY ANTERIOR APPROACH;  Surgeon: Hessie Knows, MD;  Location: [facility] ORS;  Service: Orthopedics;  Laterality: Left;   TOTAL HIP ARTHROPLASTY Right 11/24/2017   Procedure: TOTAL HIP ARTHROPLASTY ANTERIOR APPROACH;  Surgeon: Hessie Knows, MD;  Location: [facility] ORS;  Service: Orthopedics;  Laterality: Right;    Family History  Problem Relation Age of Onset   Stroke Mother    COPD Sister    Thyroid  disease Sister    Heart disease Brother    Thyroid disease Other    Colon cancer Neg Hx    Colon polyps Neg Hx    Diabetes Neg Hx    Kidney disease Neg Hx    Esophageal cancer Neg Hx    Breast cancer Neg Hx     Social History   Socioeconomic History   Marital status: Widowed    Spouse name: Not on file   Number of children: 0   Years of education: Not on file   Highest education level: Not on file  Occupational History   Occupation: Retired    Fish farm manager: UNEMPLOYED  Tobacco Use   Smoking status: Former    Packs/day: 0.50    Types: Cigarettes    Quit date: 09/22/1986    Years since quitting: 35.1   Smokeless tobacco: Never  Vaping Use   Vaping Use: Never used  Substance and Sexual Activity   Alcohol use: Yes    Comment: Occassionally-rare   Drug use: No   Sexual activity: Not on file  Other Topics Concern   Not on file  Social History Narrative   Married 1977, widowed 2015- husband had cancer   2 step children, 4 step grand kids   Retired   Therapist, art in independent community as of 2022   Social Determinants of Radio broadcast assistant Strain: Not on file  Food Insecurity: Not on file  Transportation Needs: Not on file  Physical Activity: Not on file  Stress: Not on file  Social Connections: Not on file  Intimate Partner Violence: Not on file    Outpatient Medications Prior to Visit  Medication Sig Dispense Refill   omeprazole (PRILOSEC) 40 MG capsule Take 1 capsule (40 mg total) by mouth daily. 30 capsule 3   sodium chloride (OCEAN) 0.65 % SOLN nasal spray Place 1 spray into both nostrils 4 (four) times daily as needed for congestion.     Garlic 735 MG CAPS Take 1 capsule (500 mg total) by mouth daily. (Patient not taking: Reported on 10/25/2021)     Multiple Vitamin (MULTIVITAMIN WITH MINERALS) TABS tablet Take 1 tablet by mouth daily. Centrum Silver (Patient not taking: Reported on 10/25/2021)     Probiotic Product (PROBIOTIC DAILY PO) Take 1 capsule by mouth  daily with supper.  (Patient not taking: Reported on 10/25/2021)     No facility-administered medications prior to visit.    Allergies  Allergen Reactions   Lactose Intolerance (Gi)     GI upset    Review of Systems  Constitutional:  Positive for malaise/fatigue. Negative for chills and fever.  HENT:  Negative for congestion, ear pain and sore throat.        Rhinorrhea   Respiratory:  Negative for cough.   Cardiovascular:  Negative for chest pain.  All other systems reviewed and are negative.     Objective:    Physical Exam Neurological:     General: No focal deficit present.  Mental Status: She is alert and oriented to person, place, and time.    Temp 98.6 F (37 C) (Oral)    Ht 5\' 6"  (1.676 m)    Wt 154 lb (69.9 kg)    BMI 24.86 kg/m  Wt Readings from Last 3 Encounters:  10/25/21 154 lb (69.9 kg)  06/20/21 154 lb (69.9 kg)  10/30/20 147 lb (66.7 kg)        Assessment & Plan:   Problem List Items Addressed This Visit       Respiratory   Upper respiratory infection, viral - Primary    Advised patient on supportive measures:  . Follow up if fever >101, if symptoms worsen or if symptoms are not improved in 3 days. Patient verbalizes understanding. Pt is also going to try to get covid tested at her independent living facility and will let us know the results.          I am having Mechele Claude B. Joya Gaskins maintain her Probiotic Product (PROBIOTIC DAILY PO), multivitamin with minerals, sodium chloride, Garlic, and omeprazole.  No orders of the defined types were placed in this encounter.    I discussed the assessment and treatment plan with the patient. The patient was provided an opportunity to ask questions and all were answered. The patient agreed with the plan and demonstrated an understanding of the instructions.   The patient was advised to call back or seek an in-person evaluation if the symptoms worsen or if the condition fails to improve as anticipated.  I  provided 15 minutes of non-face-to-face time during this encounter.   Eugenia Pancoast, [place] [facility] [place] [phone] (phone) [phone] (fax)  [place]

## 2021-10-25 NOTE — Assessment & Plan Note (Signed)
Advised patient on supportive measures:  . Follow up if fever >101, if symptoms worsen or if symptoms are not improved in 3 days. Patient verbalizes understanding. Pt is also going to try to get covid tested at her independent living facility and will let us know the results.

## 2021-11-21 DIAGNOSIS — H903 Sensorineural hearing loss, bilateral: Secondary | ICD-10-CM | POA: Diagnosis not present

## 2021-11-26 ENCOUNTER — Telehealth: Payer: Self-pay

## 2021-11-26 NOTE — Telephone Encounter (Signed)
Pt called to say she had her BP checked for no reason and it was 171/107. She states she does not feel bad. Denies headache, dizziness, nausea, chest pain, or arm pain. I spoke to Dr Damita Dunnings as he did not have any availability. He wants her to take her BP several times the next few days and I made her an appt at 11-28-21 at 2pm. ?

## 2021-11-28 NOTE — Telephone Encounter (Signed)
Pt came in and there was an error in scheduling on computer. I spoke with pt and apologized. Pt brought her BP readings since 11/26/21 and on 11/27/21 and 11/28/21 BP readings and P were good. Sent copy of readings for scanning into pts chart and gave copy to [place] not scanned into chart when pt comes for appt on 12/12/21.Pt said she does not have any complaints. Pt does not have CP,SOB,H/A,dizziness, and no changes in vision. Pt said after she eats a heavy meal or a lot of food she might have tightness in upper mid stomach but since pt has been eating lighter meals and less snacks pt said she has not had the tightness in her upper stomach. Pt said that on 11/26/21 there is another resident in the appts where pt lives that does stress the pt out a lot and that might have been reason for elevated BP.no covid symptoms except dry cough for 2 wks and pt had a covid test not sure of date could have been 3 wks ago at appt complex which is near [facility] and that test was neg. Pt did have a loose stool this morning but pt is not sure would be classified as diarrhea. Pt said she understands things happen and she feels fine today and can wait a couple of weeks and then schedule another appt with taking her BP once or twice a day and monitoring the BP and stomach issue and pt will cb prior to scheduled appt on 12/12/21 at 11 AM with Dr Damita Dunnings if condition changes or worsens. Pt said also a nurse available at the appt complex she lives at.  11/28/21 at office BP 126/78 T 97.6 P 67 and o2 sat 97% room air. Sending note to DR Damita Dunnings as Juluis Rainier and Janett Billow CMA.  ?

## 2021-11-29 NOTE — Telephone Encounter (Signed)
Noted. Agree. Thanks.

## 2021-12-09 DIAGNOSIS — H905 Unspecified sensorineural hearing loss: Secondary | ICD-10-CM | POA: Diagnosis not present

## 2021-12-12 ENCOUNTER — Ambulatory Visit (INDEPENDENT_AMBULATORY_CARE_PROVIDER_SITE_OTHER): Payer: Medicare Other | Admitting: Family Medicine

## 2021-12-12 ENCOUNTER — Encounter: Payer: Self-pay | Admitting: Family Medicine

## 2021-12-12 ENCOUNTER — Other Ambulatory Visit: Payer: Self-pay

## 2021-12-12 DIAGNOSIS — R030 Elevated blood-pressure reading, without diagnosis of hypertension: Secondary | ICD-10-CM | POA: Diagnosis not present

## 2021-12-12 DIAGNOSIS — R040 Epistaxis: Secondary | ICD-10-CM

## 2021-12-12 DIAGNOSIS — K148 Other diseases of tongue: Secondary | ICD-10-CM

## 2021-12-12 NOTE — Progress Notes (Signed)
This visit occurred during the SARS-CoV-2 public health emergency.  Safety protocols were in place, including screening questions prior to the visit, additional usage of staff PPE, and extensive cleaning of exam room while observing appropriate contact time as indicated for disinfecting solutions. ? ?Lesion on the L side of the tongue.  Recently noted.  No ulceration.  Distant brief smoker.   No pain.  No drainage. ? ?Prev BP elevations d/w pt.  She attributed it to recent stressors (new neighbor who is likely contributed, that is now improved per patient report).  Recheck BP in the meantime has been controlled per patient report.  Not on BP med currently. ? ?She is going to get new hearing aids and that should improve her situation.  Using saline and not having nosebleeds.   ? ?Meds, vitals, and allergies reviewed.  ? ?ROS: Per HPI unless specifically indicated in ROS section  ? ?Nad ?Ncat ?OP exam-  distal L tongue with 74m papule w/o ulceration.  MMM and OP wnl o/w.  Nasal exam without any lesions or bleeding/clot. ?Neck supple w/o LA.  ?Rrr ?Ctab ?Abd soft. Not ttp ?Skin well perfused. ?

## 2021-12-12 NOTE — Patient Instructions (Signed)
Ask the ENT clinic to check the spot on your tongue.  I don't suspect you will need a biopsy but I want them to check it.  ?Take care.  Glad to see you. ?Your BP is good.  ?Update me as needed.   ?

## 2021-12-15 ENCOUNTER — Telehealth: Payer: Self-pay | Admitting: Family Medicine

## 2021-12-15 DIAGNOSIS — K148 Other diseases of tongue: Secondary | ICD-10-CM | POA: Insufficient documentation

## 2021-12-15 DIAGNOSIS — R030 Elevated blood-pressure reading, without diagnosis of hypertension: Secondary | ICD-10-CM | POA: Insufficient documentation

## 2021-12-15 NOTE — Assessment & Plan Note (Signed)
Blood pressure is clearly better in the meantime.  Would not intervene otherwise.  She can update me as needed. ?

## 2021-12-15 NOTE — Assessment & Plan Note (Signed)
Resolved now.  She can update me as needed. ?

## 2021-12-15 NOTE — Assessment & Plan Note (Signed)
This looks benign but I need input from Dr. Tami Ribas with ENT.  We will route a copy of this note to him and ask for his guidance. ?

## 2021-12-15 NOTE — Telephone Encounter (Signed)
Please send a copy of my office visit note to Dr. Tami Ribas with ENT.  I need his input regarding the patient's tongue lesion.  Thanks. ?

## 2021-12-16 NOTE — Telephone Encounter (Signed)
Notes have been faxed and requested input about patients tongue lesion.  ?

## 2021-12-24 NOTE — Telephone Encounter (Signed)
Called ENT to follow up. Patient has not been seen for mouth lesions they did not get referral so no appointment was made. If we can send referral they will be happy to call for follow up. She was seen in march for ears and has appointment to pick up hearing aids this month.  ?

## 2021-12-25 NOTE — Addendum Note (Signed)
Addended by: Tonia Ghent on: 12/25/2021 09:02 AM ? ? Modules accepted: Orders ? ?

## 2021-12-25 NOTE — Telephone Encounter (Addendum)
I put in the referral.  Please let her know.  Thanks.  ?

## 2021-12-31 DIAGNOSIS — H903 Sensorineural hearing loss, bilateral: Secondary | ICD-10-CM | POA: Diagnosis not present

## 2022-01-07 ENCOUNTER — Encounter: Payer: Self-pay | Admitting: *Deleted

## 2022-01-14 ENCOUNTER — Telehealth: Payer: Self-pay | Admitting: Family Medicine

## 2022-01-14 NOTE — Telephone Encounter (Signed)
N/A unable to leave a message for patient to call back to schedule Medicare Annual Wellness Visit  ? ?Last AWV  10/30/20 ? ? ? ?Any questions, please call me at [phone]  ?

## 2022-01-20 DIAGNOSIS — D3702 Neoplasm of uncertain behavior of tongue: Secondary | ICD-10-CM | POA: Diagnosis not present

## 2022-02-12 ENCOUNTER — Ambulatory Visit (INDEPENDENT_AMBULATORY_CARE_PROVIDER_SITE_OTHER): Payer: Medicare Other

## 2022-02-12 ENCOUNTER — Encounter: Payer: Self-pay | Admitting: Family Medicine

## 2022-02-12 VITALS — Ht 66.0 in | Wt 155.0 lb

## 2022-02-12 DIAGNOSIS — Z0000 Encounter for general adult medical examination without abnormal findings: Secondary | ICD-10-CM | POA: Diagnosis not present

## 2022-02-12 NOTE — Patient Instructions (Signed)
Linda Juarez , Thank you for taking time to come for your Medicare Wellness Visit. I appreciate your ongoing commitment to your health goals. Please review the following plan we discussed and let me know if I can assist you in the future.   Screening recommendations/referrals: Colonoscopy: Done 03/06/2020. No follow up required. Mammogram: Done 01/14/2018. No follow up required. Bone Density: Done 10/27/2016. Repeat every 2 years  Recommended yearly ophthalmology/optometry visit for glaucoma screening and checkup Recommended yearly dental visit for hygiene and checkup  Vaccinations: Influenza vaccine: Done 10/30/2020 Repeat annually  Pneumococcal vaccine: Done 01/26/2015 and 12/23/2012. Tdap vaccine: Done 12/23/2012 Repeat in 10 years  Shingles vaccine:  Done 03/29/2009. Discussed Shingrix.  Covid-19:Done 07/03/2021, 02/13/2021, 06/22/2020, 10/28/2019 and 10/07/2019.    Advanced directives: Please bring a copy of your health care power of attorney and living will to the office to be added to your chart at your convenience.   Conditions/risks identified: KEEP UP THE GOOD WORK!!  Next appointment: Follow up in one year for your annual wellness visit 2024.   Preventive Care 35 Years and Older, Female Preventive care refers to lifestyle choices and visits with your health care provider that can promote health and wellness. What does preventive care include? A yearly physical exam. This is also called an annual well check. Dental exams once or twice a year. Routine eye exams. Ask your health care provider how often you should have your eyes checked. Personal lifestyle choices, including: Daily care of your teeth and gums. Regular physical activity. Eating a healthy diet. Avoiding tobacco and drug use. Limiting alcohol use. Practicing safe sex. Taking low-dose aspirin every day. Taking vitamin and mineral supplements as recommended by your health care provider. What happens during an annual well  check? The services and screenings done by your health care provider during your annual well check will depend on your age, overall health, lifestyle risk factors, and family history of disease. Counseling  Your health care provider may ask you questions about your: Alcohol use. Tobacco use. Drug use. Emotional well-being. Home and relationship well-being. Sexual activity. Eating habits. History of falls. Memory and ability to understand (cognition). Work and work Statistician. Reproductive health. Screening  You may have the following tests or measurements: Height, weight, and BMI. Blood pressure. Lipid and cholesterol levels. These may be checked every 5 years, or more frequently if you are over 16 years old. Skin check. Lung cancer screening. You may have this screening every year starting at age 81 if you have a 30-pack-year history of smoking and currently smoke or have quit within the past 15 years. Fecal occult blood test (FOBT) of the stool. You may have this test every year starting at age 34. Flexible sigmoidoscopy or colonoscopy. You may have a sigmoidoscopy every 5 years or a colonoscopy every 10 years starting at age 2. Hepatitis C blood test. Hepatitis B blood test. Sexually transmitted disease (STD) testing. Diabetes screening. This is done by checking your blood sugar (glucose) after you have not eaten for a while (fasting). You may have this done every 1-3 years. Bone density scan. This is done to screen for osteoporosis. You may have this done starting at age 77. Mammogram. This may be done every 1-2 years. Talk to your health care provider about how often you should have regular mammograms. Talk with your health care provider about your test results, treatment options, and if necessary, the need for more tests. Vaccines  Your health care provider may recommend certain vaccines,  such as: Influenza vaccine. This is recommended every year. Tetanus, diphtheria, and  acellular pertussis (Tdap, Td) vaccine. You may need a Td booster every 10 years. Zoster vaccine. You may need this after age 73. Pneumococcal 13-valent conjugate (PCV13) vaccine. One dose is recommended after age 29. Pneumococcal polysaccharide (PPSV23) vaccine. One dose is recommended after age 70. Talk to your health care provider about which screenings and vaccines you need and how often you need them. This information is not intended to replace advice given to you by your health care provider. Make sure you discuss any questions you have with your health care provider. Document Released: 10/05/2015 Document Revised: 05/28/2016 Document Reviewed: 07/10/2015 Elsevier Interactive Patient Education  2017 [place] Prevention in the Home Falls can cause injuries. They can happen to people of all ages. There are many things you can do to make your home safe and to help prevent falls. What can I do on the outside of my home? Regularly fix the edges of walkways and driveways and fix any cracks. Remove anything that might make you trip as you walk through a door, such as a raised step or threshold. Trim any bushes or trees on the path to your home. Use bright outdoor lighting. Clear any walking paths of anything that might make someone trip, such as rocks or tools. Regularly check to see if handrails are loose or broken. Make sure that both sides of any steps have handrails. Any raised decks and porches should have guardrails on the edges. Have any leaves, snow, or ice cleared regularly. Use sand or salt on walking paths during winter. Clean up any spills in your garage right away. This includes oil or grease spills. What can I do in the bathroom? Use night lights. Install grab bars by the toilet and in the tub and shower. Do not use towel bars as grab bars. Use non-skid mats or decals in the tub or shower. If you need to sit down in the shower, use a plastic, non-slip stool. Keep  the floor dry. Clean up any water that spills on the floor as soon as it happens. Remove soap buildup in the tub or shower regularly. Attach bath mats securely with double-sided non-slip rug tape. Do not have throw rugs and other things on the floor that can make you trip. What can I do in the bedroom? Use night lights. Make sure that you have a light by your bed that is easy to reach. Do not use any sheets or blankets that are too big for your bed. They should not hang down onto the floor. Have a firm chair that has side arms. You can use this for support while you get dressed. Do not have throw rugs and other things on the floor that can make you trip. What can I do in the kitchen? Clean up any spills right away. Avoid walking on wet floors. Keep items that you use a lot in easy-to-reach places. If you need to reach something above you, use a strong step stool that has a grab bar. Keep electrical cords out of the way. Do not use floor polish or wax that makes floors slippery. If you must use wax, use non-skid floor wax. Do not have throw rugs and other things on the floor that can make you trip. What can I do with my stairs? Do not leave any items on the stairs. Make sure that there are handrails on both sides of the stairs and  use them. Fix handrails that are broken or loose. Make sure that handrails are as long as the stairways. Check any carpeting to make sure that it is firmly attached to the stairs. Fix any carpet that is loose or worn. Avoid having throw rugs at the top or bottom of the stairs. If you do have throw rugs, attach them to the floor with carpet tape. Make sure that you have a light switch at the top of the stairs and the bottom of the stairs. If you do not have them, ask someone to add them for you. What else can I do to help prevent falls? Wear shoes that: Do not have high heels. Have rubber bottoms. Are comfortable and fit you well. Are closed at the toe. Do not  wear sandals. If you use a stepladder: Make sure that it is fully opened. Do not climb a closed stepladder. Make sure that both sides of the stepladder are locked into place. Ask someone to hold it for you, if possible. Clearly mark and make sure that you can see: Any grab bars or handrails. First and last steps. Where the edge of each step is. Use tools that help you move around (mobility aids) if they are needed. These include: Canes. Walkers. Scooters. Crutches. Turn on the lights when you go into a dark area. Replace any light bulbs as soon as they burn out. Set up your furniture so you have a clear path. Avoid moving your furniture around. If any of your floors are uneven, fix them. If there are any pets around you, be aware of where they are. Review your medicines with your doctor. Some medicines can make you feel dizzy. This can increase your chance of falling. Ask your doctor what other things that you can do to help prevent falls. This information is not intended to replace advice given to you by your health care provider. Make sure you discuss any questions you have with your health care provider. Document Released: 07/05/2009 Document Revised: 02/14/2016 Document Reviewed: 10/13/2014 Elsevier Interactive Patient Education  2017 [employer].

## 2022-02-12 NOTE — Progress Notes (Signed)
Subjective:   Linda Juarez is a 88 y.o. female who presents for Medicare Annual (Subsequent) preventive examination. Virtual Visit via Telephone Note  I connected with  Linda Juarez on 02/12/22 at  2:45 PM EDT by telephone and verified that I am speaking with the correct person using two identifiers.  Location: Patient: HOME Provider: [facility] Persons participating in the virtual visit: patient/Nurse Health Advisor   I discussed the limitations, risks, security and privacy concerns of performing an evaluation and management service by telephone and the availability of in person appointments. The patient expressed understanding and agreed to proceed.  Interactive audio and video telecommunications were attempted between this nurse and patient, however failed, due to patient having technical difficulties OR patient did not have access to video capability.  We continued and completed visit with audio only.  Some vital signs may be absent or patient reported.   Chriss Driver, LPN  Review of Systems     Cardiac Risk Factors include: advanced age (>27mn, >>70women);dyslipidemia;sedentary lifestyle;Other (see comment), Risk factor comments: Hip pain, s/p hip replacements.     Objective:    Today's Vitals   02/12/22 1445  Weight: 155 lb (70.3 kg)  Height: '5\' 6"'$  (1.676 m)   Body mass index is 25.02 kg/m.     02/12/2022    3:05 PM 03/06/2020    9:19 AM 10/20/2019    2:51 PM 11/24/2017   11:00 AM 11/24/2017    6:24 AM 11/17/2017    8:40 AM 09/23/2017   11:43 AM  Advanced Directives  Does Patient Have a Medical Advance Directive? Yes Yes Yes Yes Yes Yes Yes  Type of AParamed[place]of [place] will [place] will [place]Living will Living will Living will;Healthcare Power of [place] Bay[place] will [place] will  Does patient want to make changes to  medical advance directive?    No - Patient declined     Copy of [place] Chart? Yes - validated most recent copy scanned in chart (See row information) No - copy requested Yes - validated most recent copy scanned in chart (See row information)  No - copy requested  Yes  Would patient like information on creating a medical advance directive? No - Patient declined          Current Medications (verified) Outpatient Encounter Medications as of 02/12/2022  Medication Sig   Garlic 5962MG CAPS Take 1 capsule (500 mg total) by mouth daily.   Multiple Vitamin (MULTIVITAMIN WITH MINERALS) TABS tablet Take 1 tablet by mouth daily. Centrum Silver   omeprazole (PRILOSEC) 40 MG capsule Take 1 capsule (40 mg total) by mouth daily.   Probiotic Product (PROBIOTIC DAILY PO) Take 1 capsule by mouth daily with supper.   sodium chloride (OCEAN) 0.65 % SOLN nasal spray Place 1 spray into both nostrils 4 (four) times daily as needed for congestion.   No facility-administered encounter medications on file as of 02/12/2022.    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 dorsal foot   Hyperlipidemia    Past Surgical History:  Procedure Laterality Date   ABDOMINAL HYSTERECTOMY  1980's   CATARACT EXTRACTION Left 2015   CATARACT EXTRACTION W/PHACO Right 06/04/2015   Procedure: CATARACT EXTRACTION PHACO AND INTRAOCULAR LENS PLACEMENT (IOC);  Surgeon: Estill Cotta, MD;  Location: [facility] ORS;  Service: Ophthalmology;  Laterality: Right;  LOT# [id number] H [place]: 01:39.5 AP: 25.9% CDE: 42.63   CATARACT EXTRACTION, BILATERAL      CHOLECYSTECTOMY  2006   COLONOSCOPY WITH PROPOFOL N/A 03/06/2020   Procedure: COLONOSCOPY WITH PROPOFOL;  Surgeon: Virgel Manifold, MD;  Location: [facility] END[facility]COPY;  Service: Endoscopy;  Laterality: N/A;   JOINT REPLACEMENT Bilateral    SKIN SURGERY  08/06/2017   Dr. Nehemiah Massed   Stress Cardiolite  04/24/11   Normal perfusion study with no stress induced ischemia   TONSILLECTOMY AND ADENOIDECTOMY     TOTAL HIP ARTHROPLASTY Left 12/16/2016   Procedure: TOTAL HIP ARTHROPLASTY ANTERIOR APPROACH;  Surgeon: Hessie Knows, MD;  Location: [facility] ORS;  Service: Orthopedics;  Laterality: Left;   TOTAL HIP ARTHROPLASTY Right 11/24/2017   Procedure: TOTAL HIP ARTHROPLASTY ANTERIOR APPROACH;  Surgeon: Hessie Knows, MD;  Location: [facility] OR[facility];  Service: Orthopedics;  Laterality: Right;   Family History  Problem Relation Age of Onset   Stroke Mother    COPD Sister    Thyroid disease Sister    Heart disease Brother    Thyroid disease Other    Colon cancer Neg Hx    Colon polyps Neg Hx    Diabetes Neg Hx    Kidney disease Neg Hx    Esophageal cancer Neg Hx    Breast cancer Neg Hx    Social History   Socioeconomic History   Marital status: Widowed    Spouse name: Not on file   Number of children: 0   Years of education: Not on file   Highest education level: Not on file  Occupational History   Occupation: Retired    Fish farm manager: UNEMPLOYED  Tobacco Use   Smoking status: Former    Packs/day: 0.50    Types: Cigarettes    Quit date: 09/22/1986    Years since quitting: 35.4   Smokeless tobacco: Never  Vaping Use   Vaping Use: Never used  Substance and Sexual Activity   Alcohol use: Yes    Comment: Occassionally-rare   Drug use: No   Sexual activity: Not on file  Other Topics Concern   Not on file  Social History Narrative   Married 1977, widowed 2015- husband had cancer   2 step children, 4 step grand kids   Retired   Therapist, art in independent community as of 2022   Social Determinants of  Radio broadcast assistant Strain: Low Risk    Difficulty of Paying Living Expenses: Not hard at all  Food Insecurity: No Food Insecurity   Worried About Charity fundraiser in the Last Year: Never true   Arboriculturist in the Last Year: Never true  Transportation Needs: No Transportation Needs   Lack of Transportation (Medical): No   Lack of Transportation (Non-Medical): No  Physical Activity: Sufficiently Active   Days of Exercise per Week: 5 days   Minutes of Exercise per Session: 30 min  Stress: No Stress Concern Present   Feeling of Stress : Not at all  Social Connections: Moderately Integrated   Frequency of Communication with Friends and Family: More than three times a week   Frequency  of Social Gatherings with Friends and Family: More than three times a week   Attends Religious Services: More than 4 times per year   Active Member of [employer] or Organizations: Yes   Attends Archivist Meetings: More than 4 times per year   Marital Status: Widowed    Tobacco Counseling Counseling given: Not Answered   Clinical Intake:  Pre-visit preparation completed: Yes  Pain : No/denies pain     BMI - recorded: 25.02 Nutritional Status: BMI 25 -29 Overweight Nutritional Risks: None Diabetes: No  How often do you need to have someone help you when you read instructions, pamphlets, or other written materials from your doctor or pharmacy?: 1 - Never  Diabetic?NO  Interpreter Needed?: No  Information entered by :: mj Swetha Rayle, lpn   Activities of Daily Living    02/12/2022    3:07 PM  In your present state of health, do you have any difficulty performing the following activities:  Hearing? 0  Vision? 0  Difficulty concentrating or making decisions? 0  Walking or climbing stairs? 0  Dressing or bathing? 0  Doing errands, shopping? 0  Preparing Food and eating ? N  Using the Toilet? N  In the past six months, have you accidently leaked urine? Y  Comment at  times.  Do you have problems with loss of bowel control? N  Managing your Medications? N  Managing your Finances? N  Housekeeping or managing your Housekeeping? N    Patient Care Team: Tonia Ghent, MD as PCP - General (Family Medicine) Hessie Knows, MD as Consulting Physician (Orthopedic Surgery) Ralene Bathe, MD as Referring Physician (Dermatology) Estill Cotta, MD as Referring Physician (Ophthalmology) Beverly Gust, MD (Otolaryngology)  Indicate any recent Medical Services you may have received from other than [facility] providers in the past year (date may be approximate).     Assessment:   This is a routine wellness examination for Linda Juarez.  Hearing/Vision screen Hearing Screening - Comments:: Hearing aids.  Vision Screening - Comments:: Readers. [place].  Dietary issues and exercise activities discussed: Current Exercise Habits: Home exercise routine, Type of exercise: walking, Time (Minutes): 30, Frequency (Times/Week): 5, Weekly Exercise (Minutes/Week): 150, Intensity: Mild, Exercise limited by: cardiac condition(s);orthopedic condition(s)   Goals Addressed             This Visit's Progress    Increase water intake   On track    Starting 09/19/2016, I will attempt to drink at least 6-8 glasses of water daily.       Patient Stated   On track    10/20/2019, I will try to walk more daily.       Depression Screen    02/12/2022    2:55 PM 10/30/2020    2:08 PM 10/20/2019    2:54 PM 10/04/2018   10:42 AM 09/23/2017   11:40 AM 09/18/2016    1:31 PM 01/26/2015   11:20 AM  PHQ 2/9 Scores  PHQ - 2 Score 0 0 0 0 0 0 0  PHQ- 9 Score   0  0      Fall Risk    02/12/2022    3:07 PM 10/30/2020    2:07 PM 10/20/2019    2:53 PM 10/04/2018   10:42 AM 09/23/2017   11:40 AM  Fall Risk   Falls in the past year? 0 0 0 0 Yes  Comment     fell during snow storm  Number falls in past yr: 0  0 0  1  Injury with Fall? 0 0 0  No  Risk for fall due to : No Fall  Risks History of fall(s);Impaired mobility Impaired mobility    Follow up Falls prevention discussed Falls evaluation completed Falls evaluation completed;Falls prevention discussed      FALL RISK PREVENTION PERTAINING TO THE HOME:  Any stairs in or around the home? No  If so, are there any without handrails? No  Home free of loose throw rugs in walkways, pet beds, electrical cords, etc? Yes  Adequate lighting in your home to reduce risk of falls? Yes   ASSISTIVE DEVICES UTILIZED TO PREVENT FALLS:  Life alert? Yes  Use of a cane, walker or w/c? No  Grab bars in the bathroom? Yes  Shower chair or bench in shower? Yes  Elevated toilet seat or a handicapped toilet? Yes   TIMED UP AND GO:  Was the test performed? No .  Phone visit.   Cognitive Function:    10/20/2019    2:59 PM 09/23/2017   11:42 AM 09/18/2016    1:32 PM  MMSE - Mini Mental State Exam  Orientation to time '5 5 5  '$ Orientation to Place '5 5 5  '$ Registration '3 3 3  '$ Attention/ Calculation 5 0 0  Recall '3 3 3  '$ Language- name 2 objects  0 0  Language- repeat '1 1 1  '$ Language- follow 3 step command  3 3  Language- read & follow direction  0 0  Write a sentence  0 0  Copy design  0 0  Total score  20 20        02/12/2022    3:13 PM  6CIT Screen  What Year? 0 points  What month? 0 points  What time? 0 points  Count back from 20 0 points  Months in reverse 2 points  Repeat phrase 0 points  Total Score 2 points    Immunizations Immunization History  Administered Date(s) Administered   Fluad Quad(high Dose 65+) 10/30/2020   Influenza Whole 07/28/2013   Influenza, High Dose Seasonal PF 06/30/2017, 07/03/2021   Influenza,inj,Quad PF,6+ Mos 10/09/2015, 09/18/2016, 08/16/2018, 06/23/2019   Influenza-Unspecified 12/25/2014, 06/30/2017   PFIZER(Purple Top)SARS-COV-2 Vaccination 10/07/2019, 10/28/2019, 07/11/2020, 02/13/2021, 07/03/2021   Pneumococcal Conjugate-13 01/26/2015   Pneumococcal Polysaccharide-23  12/23/2012   Td 12/23/2012   Zoster, Live 03/29/2009    TDAP status: Up to date  Flu Vaccine status: Up to date  Pneumococcal vaccine status: Up to date  Covid-19 vaccine status: Completed vaccines  Qualifies for Shingles Vaccine? Yes   Zostavax completed Yes   Shingrix Completed?: No.    Education has been provided regarding the importance of this vaccine. Patient has been advised to call insurance company to determine out of pocket expense if they have not yet received this vaccine. Advised may also receive vaccine at local pharmacy or Health Dept. Verbalized acceptance and understanding.  Screening Tests Health Maintenance  Topic Date Due   Zoster Vaccines- Shingrix (1 of 2) 09/19/2022 (Originally 03/08/1982)   INFLUENZA VACCINE  04/22/2022   TETANUS/TDAP  12/24/2022   Pneumonia Vaccine 37+ Years old  Completed   DEXA SCAN  Completed   COVID-19 Vaccine  Completed   HPV VACCINES  Aged Out    Health Maintenance  There are no preventive care reminders to display for this patient.  Colorectal cancer screening: No longer required.   Mammogram status: No longer required due to AGE.  Bone Density status: Completed 10/27/2016. Results reflect:  Bone density results: NORMAL. Repeat every 2 years.  Lung Cancer Screening: (Low Dose CT Chest recommended if Age 64-80 years, 30 pack-year currently smoking OR have quit w/in 15years.) does not qualify.  Additional Screening:  Hepatitis C Screening: does not qualify;  Vision Screening: Recommended annual ophthalmology exams for early detection of glaucoma and other disorders of the eye. Is the patient up to date with their annual eye exam?  Yes  Who is the provider or what is the name of the office in which the patient attends annual eye exams? [place] If pt is not established with a provider, would they like to be referred to a provider to establish care? No .   Dental Screening: Recommended annual dental exams for proper oral  hygiene  Community Resource Referral / Chronic Care Management: CRR required this visit?  No   CCM required this visit?  No      Plan:     I have personally reviewed and noted the following in the patient's chart:   Medical and social history Use of alcohol, tobacco or illicit drugs  Current medications and supplements including opioid prescriptions.  Functional ability and status Nutritional status Physical activity Advanced directives List of other physicians Hospitalizations, surgeries, and ER visits in previous 12 months Vitals Screenings to include cognitive, depression, and falls Referrals and appointments  In addition, I have reviewed and discussed with patient certain preventive protocols, quality metrics, and best practice recommendations. A written personalized care plan for preventive services as well as general preventive health recommendations were provided to patient.     Chriss Driver, LPN   3/46/2194   Nurse Notes: Discussed Shingrix and how to obtain.

## 2022-02-21 ENCOUNTER — Ambulatory Visit (INDEPENDENT_AMBULATORY_CARE_PROVIDER_SITE_OTHER): Payer: Medicare Other | Admitting: Family Medicine

## 2022-02-21 ENCOUNTER — Encounter: Payer: Self-pay | Admitting: Family Medicine

## 2022-02-21 VITALS — BP 140/84 | HR 61 | Temp 97.7°F | Ht 66.0 in | Wt 156.0 lb

## 2022-02-21 DIAGNOSIS — K219 Gastro-esophageal reflux disease without esophagitis: Secondary | ICD-10-CM

## 2022-02-21 DIAGNOSIS — R5383 Other fatigue: Secondary | ICD-10-CM | POA: Diagnosis not present

## 2022-02-21 LAB — COMPREHENSIVE METABOLIC PANEL
ALT: 23 U/L (ref 0–35)
AST: 23 U/L (ref 0–37)
Albumin: 4.2 g/dL (ref 3.5–5.2)
Alkaline Phosphatase: 97 U/L (ref 39–117)
BUN: 26 mg/dL — ABNORMAL HIGH (ref 6–23)
CO2: 30 mEq/L (ref 19–32)
Calcium: 9.7 mg/dL (ref 8.4–10.5)
Chloride: 104 mEq/L (ref 96–112)
Creatinine, Ser: 0.99 mg/dL (ref 0.40–1.20)
GFR: 51.11 mL/min — ABNORMAL LOW (ref >60.00)
Glucose, Bld: 88 mg/dL (ref 70–99)
Potassium: 4.9 mEq/L (ref 3.5–5.1)
Sodium: 141 mEq/L (ref 135–145)
Total Bilirubin: 0.5 mg/dL (ref 0.2–1.2)
Total Protein: 7 g/dL (ref 6.0–8.3)

## 2022-02-21 LAB — CBC WITH DIFFERENTIAL/PLATELET
Basophils Absolute: 0 10*3/uL (ref 0.0–0.1)
Basophils Relative: 0.6 % (ref 0.0–3.0)
Eosinophils Absolute: 0.1 10*3/uL (ref 0.0–0.7)
Eosinophils Relative: 2 % (ref 0.0–5.0)
HCT: 43 % (ref 36.0–46.0)
Hemoglobin: 14.1 g/dL (ref 12.0–15.0)
Lymphocytes Relative: 19.8 % (ref 12.0–46.0)
Lymphs Abs: 1.4 10*3/uL (ref 0.7–4.0)
MCHC: 32.9 g/dL (ref 30.0–36.0)
MCV: 92.1 fl (ref 78.0–100.0)
Monocytes Absolute: 0.5 10*3/uL (ref 0.1–1.0)
Monocytes Relative: 6.8 % (ref 3.0–12.0)
Neutro Abs: 4.8 10*3/uL (ref 1.4–7.7)
Neutrophils Relative %: 70.8 % (ref 43.0–77.0)
Platelets: 211 10*3/uL (ref 150.0–400.0)
RBC: 4.67 Mil/uL (ref 3.87–5.11)
RDW: 13.3 % (ref 11.5–15.5)
WBC: 6.8 10*3/uL (ref 4.0–10.5)

## 2022-02-21 LAB — TSH: TSH: 4.16 u[IU]/mL (ref 0.35–5.50)

## 2022-02-21 MED ORDER — GARLIC 500 MG PO CAPS: 2000.0000 mg | ORAL_CAPSULE | ORAL | Status: DC

## 2022-02-21 MED ORDER — OMEPRAZOLE 40 MG PO CPDR
40.0000 mg | DELAYED_RELEASE_CAPSULE | Freq: Every day | ORAL | 3 refills | Status: DC
Start: 2022-02-21 — End: 2023-01-08

## 2022-02-21 NOTE — Progress Notes (Signed)
She got hearing aids and they help.  She has ENT clinic f/u pending.  She is done with prednisone course per ENT.    GI sx.  She thought she was eating too fast.  No choking, no upper or mid esophagus sx.  She is off PPI currently.  She feels bloated after eating.  No pain.  No black stools.  No blood in stools. No vomiting.  Fatigue noted over the last month.  No vomiting.  Still off lactose at baseline.  She thought that being on prilosec helped.    Meds, vitals, and allergies reviewed.   ROS: Per HPI unless specifically indicated in ROS section   GEN: nad, alert and oriented HEENT: ncat NECK: supple w/o LA CV: rrr.  PULM: ctab, no inc wob ABD: soft, +bs EXT: no edema SKIN: no acute rash

## 2022-02-21 NOTE — Patient Instructions (Addendum)
Go to the lab on the way out.   If you have mychart we'll likely use that to update you.    Restart omeprazole and try to take your time with eating.  Take care.  Glad to see you. Update me if not better.   If you are doing better then gradually taper off the medicine in about 1-2 months (take the medicine every other day).  Check with your insurance to see if they will cover the shingles shot.

## 2022-02-23 DIAGNOSIS — R5383 Other fatigue: Secondary | ICD-10-CM | POA: Insufficient documentation

## 2022-02-23 DIAGNOSIS — K219 Gastro-esophageal reflux disease without esophagitis: Secondary | ICD-10-CM | POA: Insufficient documentation

## 2022-02-23 NOTE — Assessment & Plan Note (Signed)
See notes on labs. 

## 2022-02-23 NOTE — Assessment & Plan Note (Addendum)
Restart omeprazole and advised to take her time with eating.  Update me if not better.   If better then gradually taper off the omeprazole in about 1-2 months (ie take the medicine every other day then stop).

## 2022-03-03 DIAGNOSIS — D3702 Neoplasm of uncertain behavior of tongue: Secondary | ICD-10-CM | POA: Diagnosis not present

## 2022-12-28 ENCOUNTER — Other Ambulatory Visit: Payer: Self-pay | Admitting: Family Medicine

## 2022-12-28 DIAGNOSIS — Z96649 Presence of unspecified artificial hip joint: Secondary | ICD-10-CM

## 2022-12-28 DIAGNOSIS — E785 Hyperlipidemia, unspecified: Secondary | ICD-10-CM

## 2023-01-01 ENCOUNTER — Other Ambulatory Visit (INDEPENDENT_AMBULATORY_CARE_PROVIDER_SITE_OTHER): Payer: Medicare PPO

## 2023-01-01 DIAGNOSIS — Z96649 Presence of unspecified artificial hip joint: Secondary | ICD-10-CM

## 2023-01-01 DIAGNOSIS — E785 Hyperlipidemia, unspecified: Secondary | ICD-10-CM | POA: Diagnosis not present

## 2023-01-01 LAB — CBC WITH DIFFERENTIAL/PLATELET
Basophils Absolute: 0.1 10*3/uL (ref 0.0–0.1)
Basophils Relative: 1.2 % (ref 0.0–3.0)
Eosinophils Absolute: 0.1 10*3/uL (ref 0.0–0.7)
Eosinophils Relative: 2.4 % (ref 0.0–5.0)
HCT: 43.2 % (ref 36.0–46.0)
Hemoglobin: 14.3 g/dL (ref 12.0–15.0)
Lymphocytes Relative: 23.9 % (ref 12.0–46.0)
Lymphs Abs: 1.3 10*3/uL (ref 0.7–4.0)
MCHC: 33.2 g/dL (ref 30.0–36.0)
MCV: 90.8 fl (ref 78.0–100.0)
Monocytes Absolute: 0.4 10*3/uL (ref 0.1–1.0)
Monocytes Relative: 6.9 % (ref 3.0–12.0)
Neutro Abs: 3.7 10*3/uL (ref 1.4–7.7)
Neutrophils Relative %: 65.6 % (ref 43.0–77.0)
Platelets: 224 10*3/uL (ref 150.0–400.0)
RBC: 4.75 Mil/uL (ref 3.87–5.11)
RDW: 13.4 % (ref 11.5–15.5)
WBC: 5.6 10*3/uL (ref 4.0–10.5)

## 2023-01-01 LAB — COMPREHENSIVE METABOLIC PANEL
ALT: 19 U/L (ref 0–35)
AST: 22 U/L (ref 0–37)
Albumin: 4.1 g/dL (ref 3.5–5.2)
Alkaline Phosphatase: 84 U/L (ref 39–117)
BUN: 20 mg/dL (ref 6–23)
CO2: 30 mEq/L (ref 19–32)
Calcium: 9.6 mg/dL (ref 8.4–10.5)
Chloride: 103 mEq/L (ref 96–112)
Creatinine, Ser: 1.03 mg/dL (ref 0.40–1.20)
GFR: 48.44 mL/min — ABNORMAL LOW (ref >60.00)
Glucose, Bld: 89 mg/dL (ref 70–99)
Potassium: 4.2 mEq/L (ref 3.5–5.1)
Sodium: 141 mEq/L (ref 135–145)
Total Bilirubin: 0.6 mg/dL (ref 0.2–1.2)
Total Protein: 6.8 g/dL (ref 6.0–8.3)

## 2023-01-01 LAB — VITAMIN D 25 HYDROXY (VIT D DEFICIENCY, FRACTURES): VITD: 58.46 ng/mL (ref 30.00–100.00)

## 2023-01-01 LAB — LIPID PANEL
Cholesterol: 210 mg/dL — ABNORMAL HIGH (ref 0–200)
HDL: 44.5 mg/dL (ref >39.00)
LDL Cholesterol: 131 mg/dL — ABNORMAL HIGH (ref 0–99)
NonHDL: 165.97
Total CHOL/HDL Ratio: 5
Triglycerides: 176 mg/dL — ABNORMAL HIGH (ref 0.0–149.0)
VLDL: 35.2 mg/dL (ref 0.0–40.0)

## 2023-01-08 ENCOUNTER — Encounter: Payer: Self-pay | Admitting: Family Medicine

## 2023-01-08 ENCOUNTER — Ambulatory Visit (INDEPENDENT_AMBULATORY_CARE_PROVIDER_SITE_OTHER): Payer: Medicare PPO | Admitting: Family Medicine

## 2023-01-08 VITALS — BP 106/68 | HR 63 | Temp 97.5°F | Ht 66.0 in | Wt 157.0 lb

## 2023-01-08 DIAGNOSIS — Z0000 Encounter for general adult medical examination without abnormal findings: Secondary | ICD-10-CM | POA: Diagnosis not present

## 2023-01-08 DIAGNOSIS — Z7189 Other specified counseling: Secondary | ICD-10-CM

## 2023-01-08 DIAGNOSIS — G629 Polyneuropathy, unspecified: Secondary | ICD-10-CM

## 2023-01-08 LAB — TSH: TSH: 3.05 u[IU]/mL (ref 0.35–5.50)

## 2023-01-08 LAB — VITAMIN B12: Vitamin B-12: 311 pg/mL (ref 211–911)

## 2023-01-08 NOTE — Patient Instructions (Addendum)
Go to the lab on the way out.   If you have mychart we'll likely use that to update you.    Take care.  Glad to see you. Update me as needed.   If your foot symptoms are getting worse then let me know.

## 2023-01-08 NOTE — Progress Notes (Signed)
I have personally reviewed the Medicare Annual Wellness questionnaire and have noted 1. The patient's medical and social history 2. Their use of alcohol, tobacco or illicit drugs 3. Their current medications and supplements 4. The patient's functional ability including ADL's, fall risks, home safety risks and hearing or visual             impairment. 5. Diet and physical activities 6. Evidence for depression or mood disorders  The patients weight, height, BMI have been recorded in the chart and visual acuity is per eye clinic.  I have made referrals, counseling and provided education to the patient based review of the above and I have provided the pt with a written personalized care plan for preventive services.  Provider list updated- see scanned forms.  Routine anticipatory guidance given to patient.  See health maintenance. The possibility exists that previously documented standard health maintenance information may have been brought forward from a previous encounter into this note.  If needed, that same information has been updated to reflect the current situation based on today's encounter.    Flu prev done Shingles discussed with patient, previously had Zostavax. PNA up-to-date Tetanus 2014 Covid vaccine up-to-date. RSV vaccine prev done.  Colonoscopy 2021 Breast cancer screening discussed with patient.  She will consider. Bone density test discussed with patient.  She can consider Advance directive d/w pt- nephew Ronnie Derby designated if patient were incapacitated.   Cognitive function addressed- see scanned forms- and if abnormal then additional documentation follows.   In addition to [facility] Wellness, follow up visit for the below conditions:  She got new hearing aids.    Possible neuropathy d/w pt.  Pain with walking.  From the feet up to the shins B.  Using compression stockings and that helps.  Some numbness on the bottom of the feet.  Symmetric.  No hand sx.  No pain in  the back.  No radicular pain.    PMH and SH reviewed  Meds, vitals, and allergies reviewed.   ROS: Per HPI.  Unless specifically indicated otherwise in HPI, the patient denies:  General: fever. Eyes: acute vision changes ENT: sore throat Cardiovascular: chest pain Respiratory: SOB GI: vomiting GU: dysuria Musculoskeletal: acute back pain Derm: acute rash Neuro: acute motor dysfunction Psych: worsening mood Endocrine: polydipsia Heme: bleeding Allergy: hayfever  GEN: nad, alert and oriented HEENT: ncat NECK: supple w/o LA CV: rrr. PULM: ctab, no inc wob ABD: soft, +bs EXT: no edema SKIN: no acute rash  Normal S/S BLE (normal monofilament and light touch).  No weakness. Normal DP pulses.

## 2023-01-11 ENCOUNTER — Telehealth: Payer: Self-pay | Admitting: Family Medicine

## 2023-01-11 DIAGNOSIS — G629 Polyneuropathy, unspecified: Secondary | ICD-10-CM | POA: Insufficient documentation

## 2023-01-11 NOTE — Assessment & Plan Note (Signed)
Advance directive d/w pt- nephew John Faucette designated if patient were incapacitated.  

## 2023-01-11 NOTE — Telephone Encounter (Signed)
This patient had seen Dr. Maximino Greenland previously.  Given her age, if she is still a candidate for follow-up colonoscopy?  I am asking based on the letter from Dr. Maximino Greenland in 2021.  Thanks.

## 2023-01-11 NOTE — Assessment & Plan Note (Signed)
Flu prev done Shingles discussed with patient, previously had Zostavax. PNA up-to-date Tetanus 2014 Covid vaccine up-to-date. RSV vaccine prev done.  Colonoscopy 2021 Breast cancer screening discussed with patient.  She will consider. Bone density test discussed with patient.  She can consider Advance directive d/w pt- nephew Ronnie Derby designated if patient were incapacitated.

## 2023-01-11 NOTE — Assessment & Plan Note (Signed)
Strength and sensation is normal on the lower extremities.  She has symptoms suggestive of neuropathy.  Check labs for reversible causes.  See notes on labs.

## 2023-01-12 NOTE — Telephone Encounter (Signed)
LMTCB; also see result note for another message.

## 2023-01-12 NOTE — Telephone Encounter (Signed)
Please update patient.  Wouldn't need colon cancer screening now.  Thanks.  See below.

## 2023-01-12 NOTE — Telephone Encounter (Signed)
Patient aware does not need colon cancer screening done

## 2023-01-12 NOTE — Telephone Encounter (Signed)
I wouldn't recommend colon cancer screening at her age

## 2023-02-12 ENCOUNTER — Other Ambulatory Visit: Payer: Medicare Other

## 2023-02-19 ENCOUNTER — Encounter: Payer: Medicare Other | Admitting: Family Medicine

## 2023-03-24 DIAGNOSIS — I10 Essential (primary) hypertension: Secondary | ICD-10-CM | POA: Diagnosis not present

## 2023-03-24 DIAGNOSIS — Z87891 Personal history of nicotine dependence: Secondary | ICD-10-CM | POA: Diagnosis not present

## 2023-03-24 DIAGNOSIS — I739 Peripheral vascular disease, unspecified: Secondary | ICD-10-CM | POA: Diagnosis not present

## 2023-03-24 DIAGNOSIS — Z823 Family history of stroke: Secondary | ICD-10-CM | POA: Diagnosis not present

## 2023-03-24 DIAGNOSIS — M2010 Hallux valgus (acquired), unspecified foot: Secondary | ICD-10-CM | POA: Diagnosis not present

## 2023-03-24 DIAGNOSIS — Z8379 Family history of other diseases of the digestive system: Secondary | ICD-10-CM | POA: Diagnosis not present

## 2023-03-24 DIAGNOSIS — Z96643 Presence of artificial hip joint, bilateral: Secondary | ICD-10-CM | POA: Diagnosis not present

## 2023-03-24 DIAGNOSIS — E785 Hyperlipidemia, unspecified: Secondary | ICD-10-CM | POA: Diagnosis not present

## 2023-06-10 DIAGNOSIS — Z961 Presence of intraocular lens: Secondary | ICD-10-CM | POA: Diagnosis not present

## 2023-06-10 DIAGNOSIS — Z0100 Encounter for examination of eyes and vision without abnormal findings: Secondary | ICD-10-CM | POA: Diagnosis not present

## 2023-06-10 DIAGNOSIS — Z9889 Other specified postprocedural states: Secondary | ICD-10-CM | POA: Diagnosis not present

## 2023-08-05 ENCOUNTER — Telehealth: Payer: Self-pay

## 2023-08-05 NOTE — Telephone Encounter (Signed)
Patient left a voicemail stating that she was calling to make a appointment with Dr. Maximino Greenland and wants to schedule this appointment within the next few days. Return patient call and informed patient that Dr. Maximino Greenland is no longer with [place] anymore and she has not been seen in over 3 years so she is a new patient. She would need a referral from her PCP referring her to our office. She states she will call him to get this done

## 2023-09-01 ENCOUNTER — Ambulatory Visit: Payer: Medicare PPO | Admitting: Family Medicine

## 2023-09-01 ENCOUNTER — Encounter: Payer: Self-pay | Admitting: Family Medicine

## 2023-09-01 VITALS — BP 134/80 | HR 69 | Temp 97.6°F | Ht 66.0 in | Wt 166.0 lb

## 2023-09-01 DIAGNOSIS — F5083 Pica in adults: Secondary | ICD-10-CM

## 2023-09-01 DIAGNOSIS — G629 Polyneuropathy, unspecified: Secondary | ICD-10-CM

## 2023-09-01 NOTE — Progress Notes (Unsigned)
She accidentally changed from probiotics to dulcolax.  She was having recurrent diarrhea.  She changed back to a probiotic and improved in the meantime. No black stools.  No bloody stools.  She was taking dulcolax for likely a few months.  She has been off dulcolax for a few months.    She didn't have GI sx until relatively late in the dulcolax course.  No FCNAVD now.    She has altered sensation in the B feet, plantar side.  Sx up to mid shin.    She noted she was eating ice, d/w pt.    Meds, vitals, and allergies reviewed.   ROS: Per HPI unless specifically indicated in ROS section   Dec sensation in the feet with monofilament but normal strength.     She can get a flu shot done at her apartment building if needed.  D/w pt.

## 2023-09-01 NOTE — Patient Instructions (Signed)
Go to the lab on the way out.   If you have mychart we'll likely use that to update you.    Take care.  Glad to see you. 

## 2023-09-02 DIAGNOSIS — F5083 Pica in adults: Secondary | ICD-10-CM | POA: Insufficient documentation

## 2023-09-02 LAB — COMPREHENSIVE METABOLIC PANEL
ALT: 24 U/L (ref 0–35)
AST: 27 U/L (ref 0–37)
Albumin: 4.3 g/dL (ref 3.5–5.2)
Alkaline Phosphatase: 92 U/L (ref 39–117)
BUN: 25 mg/dL — ABNORMAL HIGH (ref 6–23)
CO2: 29 meq/L (ref 19–32)
Calcium: 9.5 mg/dL (ref 8.4–10.5)
Chloride: 106 meq/L (ref 96–112)
Creatinine, Ser: 1.09 mg/dL (ref 0.40–1.20)
GFR: 45.05 mL/min — ABNORMAL LOW (ref >60.00)
Glucose, Bld: 117 mg/dL — ABNORMAL HIGH (ref 70–99)
Potassium: 4.5 meq/L (ref 3.5–5.1)
Sodium: 144 meq/L (ref 135–145)
Total Bilirubin: 0.4 mg/dL (ref 0.2–1.2)
Total Protein: 6.6 g/dL (ref 6.0–8.3)

## 2023-09-02 LAB — TSH: TSH: 2.55 u[IU]/mL (ref 0.35–5.50)

## 2023-09-02 LAB — CBC WITH DIFFERENTIAL/PLATELET
Basophils Absolute: 0.1 10*3/uL (ref 0.0–0.1)
Basophils Relative: 1.3 % (ref 0.0–3.0)
Eosinophils Absolute: 0.1 10*3/uL (ref 0.0–0.7)
Eosinophils Relative: 2.2 % (ref 0.0–5.0)
HCT: 43.4 % (ref 36.0–46.0)
Hemoglobin: 14.3 g/dL (ref 12.0–15.0)
Lymphocytes Relative: 28.7 % (ref 12.0–46.0)
Lymphs Abs: 1.9 10*3/uL (ref 0.7–4.0)
MCHC: 32.8 g/dL (ref 30.0–36.0)
MCV: 93.3 fL (ref 78.0–100.0)
Monocytes Absolute: 0.6 10*3/uL (ref 0.1–1.0)
Monocytes Relative: 9.4 % (ref 3.0–12.0)
Neutro Abs: 3.8 10*3/uL (ref 1.4–7.7)
Neutrophils Relative %: 58.4 % (ref 43.0–77.0)
Platelets: 241 10*3/uL (ref 150.0–400.0)
RBC: 4.65 Mil/uL (ref 3.87–5.11)
RDW: 14.4 % (ref 11.5–15.5)
WBC: 6.5 10*3/uL (ref 4.0–10.5)

## 2023-09-02 LAB — VITAMIN B12: Vitamin B-12: 272 pg/mL (ref 211–911)

## 2023-09-02 LAB — FERRITIN: Ferritin: 128 ng/mL (ref 10.0–291.0)

## 2023-09-02 LAB — IRON: Iron: 66 ug/dL (ref 42–145)

## 2023-09-02 NOTE — Assessment & Plan Note (Signed)
Decree sensation in the feet with monofilament but she does not have a foot drop.  Differential discussed.  See notes on labs.

## 2023-09-02 NOTE — Assessment & Plan Note (Signed)
Had been eating ice frequently.  See notes on labs.

## 2023-09-13 ENCOUNTER — Other Ambulatory Visit: Payer: Self-pay | Admitting: Family Medicine

## 2023-09-13 DIAGNOSIS — G629 Polyneuropathy, unspecified: Secondary | ICD-10-CM

## 2023-09-13 MED ORDER — CYANOCOBALAMIN 1000 MCG/ML IJ SOLN
1000.0000 ug | INTRAMUSCULAR | Status: DC
Start: 2023-09-13 — End: 2023-11-30

## 2023-09-29 ENCOUNTER — Ambulatory Visit: Payer: Medicare PPO

## 2023-09-30 ENCOUNTER — Ambulatory Visit (INDEPENDENT_AMBULATORY_CARE_PROVIDER_SITE_OTHER): Payer: Medicare Other

## 2023-09-30 DIAGNOSIS — E538 Deficiency of other specified B group vitamins: Secondary | ICD-10-CM | POA: Diagnosis not present

## 2023-09-30 MED ORDER — CYANOCOBALAMIN 1000 MCG/ML IJ SOLN
1000.0000 ug | Freq: Once | INTRAMUSCULAR | Status: AC
  Administered 2023-09-30: 1000 ug via INTRAMUSCULAR

## 2023-09-30 NOTE — Progress Notes (Signed)
 Per orders of Dr. Crawford Givens, injection of B-12 given by Leonor Liv in left deltoid. Patient tolerated injection well. Patient will make appointment for 2 month.

## 2023-11-03 ENCOUNTER — Ambulatory Visit: Payer: Medicare Other

## 2023-11-05 ENCOUNTER — Ambulatory Visit (INDEPENDENT_AMBULATORY_CARE_PROVIDER_SITE_OTHER): Payer: Medicare Other

## 2023-11-05 DIAGNOSIS — E538 Deficiency of other specified B group vitamins: Secondary | ICD-10-CM

## 2023-11-05 MED ORDER — CYANOCOBALAMIN 1000 MCG/ML IJ SOLN
1000.0000 ug | Freq: Once | INTRAMUSCULAR | Status: AC
Start: 2023-11-05 — End: 2023-11-05
  Administered 2023-11-05: 1000 ug via INTRAMUSCULAR

## 2023-11-05 NOTE — Progress Notes (Signed)
Per orders of Dr. Crawford Givens, injection of B-12 given by Nanci Pina in right deltoid. Patient tolerated injection well.

## 2023-11-09 ENCOUNTER — Telehealth: Payer: Self-pay | Admitting: Family Medicine

## 2023-11-09 NOTE — Telephone Encounter (Signed)
 Return call to patient. Scheduled her for her next B12 injection

## 2023-11-09 NOTE — Telephone Encounter (Signed)
 PLEASE NOTE: All timestamps contained within this report are represented as [place] Standard Time. CONFIDENTIALTY NOTICE: This fax transmission is intended only for the addressee. It contains information that is legally privileged, confidential or otherwise protected from use or disclosure. If you are not the intended recipient, you are strictly prohibited from reviewing, disclosing, copying using or disseminating any of this information or taking any action in reliance on or regarding this information. If you have received this fax in error, please notify us immediately by telephone so that we can arrange for its return to [place]. Phone: [phone], Toll-Free: [phone], Fax: [phone] Page: 1 of 1 Call Id: [id number] [facility] Night - Client Nonclinical Telephone Record  [facility] Client [facility] Night - Client Client Site [facility] - Night Provider Crawford Givens "Clelia Croft MD Contact Type Call Who Is Calling Patient / Member / Family / Caregiver Caller Name Muskaan Smet Caller Phone Number [phone] Patient Name Linda Juarez Patient DOB [birth year] Call Type Message Only Information Provided Reason for Call Request for General Office Information Initial Comment Caller states she got a notification about results but she does not use the portal, needs to know the info Additional Comment Office hours provided Disp. Time Disposition Final User 11/07/2023 4:27:45 PM General Information Provided Yes Darl Householder Call Closed By: Darl Householder Transaction Date/Time: 11/07/2023 4:24:57 PM (ET)

## 2023-11-29 ENCOUNTER — Observation Stay
Admission: EM | Admit: 2023-11-29 | Discharge: 2023-11-30 | Disposition: A | Discharge status: Home / Self Care | Attending: Internal Medicine | Admitting: Internal Medicine

## 2023-11-29 ENCOUNTER — Emergency Department

## 2023-11-29 ENCOUNTER — Observation Stay

## 2023-11-29 ENCOUNTER — Other Ambulatory Visit: Payer: Self-pay

## 2023-11-29 DIAGNOSIS — I7143 Infrarenal abdominal aortic aneurysm, without rupture: Secondary | ICD-10-CM | POA: Diagnosis not present

## 2023-11-29 DIAGNOSIS — Z79899 Other long term (current) drug therapy: Secondary | ICD-10-CM | POA: Diagnosis not present

## 2023-11-29 DIAGNOSIS — S065XAA Traumatic subdural hemorrhage with loss of consciousness status unknown, initial encounter: Secondary | ICD-10-CM | POA: Diagnosis not present

## 2023-11-29 DIAGNOSIS — S065X0A Traumatic subdural hemorrhage without loss of consciousness, initial encounter: Principal | ICD-10-CM | POA: Insufficient documentation

## 2023-11-29 DIAGNOSIS — Z87891 Personal history of nicotine dependence: Secondary | ICD-10-CM | POA: Insufficient documentation

## 2023-11-29 DIAGNOSIS — N1831 Chronic kidney disease, stage 3a: Secondary | ICD-10-CM | POA: Diagnosis not present

## 2023-11-29 DIAGNOSIS — F1090 Alcohol use, unspecified, uncomplicated: Secondary | ICD-10-CM | POA: Diagnosis not present

## 2023-11-29 LAB — COMPREHENSIVE METABOLIC PANEL
ALT: 39 U/L (ref 0–44)
AST: 38 U/L (ref 15–41)
Albumin: 4.2 g/dL (ref 3.5–5.0)
Alkaline Phosphatase: 76 U/L (ref 38–126)
Anion gap: 9 (ref 5–15)
BUN: 24 mg/dL — ABNORMAL HIGH (ref 8–23)
CO2: 25 mmol/L (ref 22–32)
Calcium: 9.6 mg/dL (ref 8.9–10.3)
Chloride: 105 mmol/L (ref 98–111)
Creatinine, Ser: 1.08 mg/dL — ABNORMAL HIGH (ref 0.44–1.00)
GFR, Estimated: 49 mL/min — ABNORMAL LOW (ref >60)
Glucose, Bld: 103 mg/dL — ABNORMAL HIGH (ref 70–99)
Potassium: 3.5 mmol/L (ref 3.5–5.1)
Sodium: 139 mmol/L (ref 135–145)
Total Bilirubin: 0.6 mg/dL (ref 0.0–1.2)
Total Protein: 7.5 g/dL (ref 6.5–8.1)

## 2023-11-29 LAB — CBC WITH DIFFERENTIAL/PLATELET
Abs Immature Granulocytes: 0.03 10*3/uL (ref 0.00–0.07)
Basophils Absolute: 0 10*3/uL (ref 0.0–0.1)
Basophils Relative: 0 %
Eosinophils Absolute: 0 10*3/uL (ref 0.0–0.5)
Eosinophils Relative: 0 %
HCT: 46.5 % — ABNORMAL HIGH (ref 36.0–46.0)
Hemoglobin: 15.4 g/dL — ABNORMAL HIGH (ref 12.0–15.0)
Immature Granulocytes: 0 %
Lymphocytes Relative: 8 %
Lymphs Abs: 1 10*3/uL (ref 0.7–4.0)
MCH: 30 pg (ref 26.0–34.0)
MCHC: 33.1 g/dL (ref 30.0–36.0)
MCV: 90.5 fL (ref 80.0–100.0)
Monocytes Absolute: 0.6 10*3/uL (ref 0.1–1.0)
Monocytes Relative: 5 %
Neutro Abs: 9.9 10*3/uL — ABNORMAL HIGH (ref 1.7–7.7)
Neutrophils Relative %: 87 %
Platelets: 241 10*3/uL (ref 150–400)
RBC: 5.14 MIL/uL — ABNORMAL HIGH (ref 3.87–5.11)
RDW: 13.2 % (ref 11.5–15.5)
WBC: 11.5 10*3/uL — ABNORMAL HIGH (ref 4.0–10.5)
nRBC: 0 % (ref 0.0–0.2)

## 2023-11-29 MED ORDER — IOHEXOL 300 MG/ML  SOLN
100.0000 mL | Freq: Once | INTRAMUSCULAR | Status: AC | PRN
  Administered 2023-11-29: 100 mL via INTRAVENOUS
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
## 2023-11-29 NOTE — Discharge Instructions (Addendum)
 Your CT scan of your abdomen showed that you have an aneurysm in your aorta.  Please discuss this further with your primary care doctor.  It is recommended that you have a repeat ultrasound every 5 years to monitor this.
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
## 2023-11-29 NOTE — ED Notes (Signed)
Report given to Candace RN
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
## 2023-11-29 NOTE — ED Notes (Signed)
 Patient denies pain except with movement and that pain is located in mid-thoracic back, but is not present at rest. Patient denies head pain. Patient remains alert and oriented, but is hard of hearing.

## 2023-11-29 NOTE — ED Notes (Signed)
Patient is in imaging

## 2023-11-30 ENCOUNTER — Telehealth: Payer: Self-pay | Admitting: Neurosurgery

## 2023-11-30 DIAGNOSIS — S065XAA Traumatic subdural hemorrhage with loss of consciousness status unknown, initial encounter: Secondary | ICD-10-CM | POA: Diagnosis not present

## 2023-11-30 DIAGNOSIS — N1831 Chronic kidney disease, stage 3a: Secondary | ICD-10-CM | POA: Diagnosis not present
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
## 2023-11-30 NOTE — Telephone Encounter (Signed)
 [facility] called to schedule the patient a hospital f/u. She was seen for a subdural hematoma 11/29/2023. I told the nurse that we will call the patient at home. Will she need a CT scan? When should she be seen?  Schedule with PA?
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
## 2023-11-30 NOTE — Plan of Care (Signed)
  Problem: Education: Goal: Knowledge of General Education information will improve Description: Including pain rating scale, medication(s)/side effects and non-pharmacologic comfort measures Outcome: Progressing   Problem: Clinical Measurements: Goal: Ability to maintain clinical measurements within normal limits will improve Outcome: Progressing   Problem: Nutrition: Goal: Adequate nutrition will be maintained Outcome: Progressing   Problem: Elimination: Goal: Will not experience complications related to urinary retention Outcome: Progressing

## 2023-12-01 NOTE — Telephone Encounter (Signed)
 CT has been ordered (to be done around 12/29/23)

## 2023-12-02 ENCOUNTER — Ambulatory Visit: Payer: Medicare Other

## 2023-12-02 NOTE — Telephone Encounter (Signed)
 She is aware that a CT has been ordered to be done around 12/29/23. I will call her back to schedule after with Manning Charity.

## 2023-12-03 ENCOUNTER — Encounter: Payer: Self-pay | Admitting: Family Medicine

## 2023-12-03 ENCOUNTER — Ambulatory Visit (INDEPENDENT_AMBULATORY_CARE_PROVIDER_SITE_OTHER): Admitting: Family Medicine

## 2023-12-03 VITALS — BP 128/72 | HR 59 | Temp 97.9°F | Ht 66.0 in | Wt 158.0 lb

## 2023-12-03 DIAGNOSIS — I714 Abdominal aortic aneurysm, without rupture, unspecified: Secondary | ICD-10-CM

## 2023-12-03 DIAGNOSIS — S065XAA Traumatic subdural hemorrhage with loss of consciousness status unknown, initial encounter: Secondary | ICD-10-CM | POA: Diagnosis not present

## 2023-12-03 NOTE — Patient Instructions (Signed)
 Heat/ice/tylenol as needed.  Follow up scan as scheduled.  Update me as needed.  Take care.  Glad to see you. Don't take asprin aleve ibuprofen or blood thinners.
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
## 2023-12-06 DIAGNOSIS — I714 Abdominal aortic aneurysm, without rupture, unspecified: Secondary | ICD-10-CM | POA: Insufficient documentation

## 2023-12-06 NOTE — Assessment & Plan Note (Addendum)
 Okay for outpatient f/u.  No new neuro sx.  Follow up scan as scheduled.  Update me as needed.  Advised not to take asprin aleve ibuprofen or blood thinners.   Back pain should gradually get better.  She can update me as needed.  She agrees with plan.

## 2023-12-06 NOTE — Assessment & Plan Note (Addendum)
 Incidentally noted dilated 2.7 cm infrarenal abdominal aortic aneurysm. Recommend follow-up ultrasound every 5 years. No intervention needed now.

## 2023-12-07 NOTE — Telephone Encounter (Signed)
 CT 12/29/2023 FU with Danielle on 01/05/2024 Patient  confirmed

## 2023-12-09 ENCOUNTER — Emergency Department

## 2023-12-09 ENCOUNTER — Emergency Department
Admission: EM | Admit: 2023-12-09 | Discharge: 2023-12-09 | Disposition: A | Discharge status: Home / Self Care | Attending: Emergency Medicine | Admitting: Emergency Medicine

## 2023-12-09 ENCOUNTER — Other Ambulatory Visit: Payer: Self-pay

## 2023-12-09 DIAGNOSIS — R0781 Pleurodynia: Secondary | ICD-10-CM | POA: Insufficient documentation

## 2023-12-09 DIAGNOSIS — S0990XA Unspecified injury of head, initial encounter: Secondary | ICD-10-CM | POA: Diagnosis not present

## 2023-12-09 DIAGNOSIS — W01198A Fall on same level from slipping, tripping and stumbling with subsequent striking against other object, initial encounter: Secondary | ICD-10-CM | POA: Insufficient documentation

## 2023-12-09 DIAGNOSIS — W19XXXA Unspecified fall, initial encounter: Secondary | ICD-10-CM

## 2023-12-09 DIAGNOSIS — Z8679 Personal history of other diseases of the circulatory system: Secondary | ICD-10-CM | POA: Diagnosis not present

## 2023-12-09 DIAGNOSIS — M25561 Pain in right knee: Secondary | ICD-10-CM | POA: Insufficient documentation

## 2023-12-09 DIAGNOSIS — M25551 Pain in right hip: Secondary | ICD-10-CM | POA: Diagnosis present

## 2023-12-09 DIAGNOSIS — M546 Pain in thoracic spine: Secondary | ICD-10-CM | POA: Diagnosis not present

## 2023-12-09 LAB — BASIC METABOLIC PANEL
Anion gap: 9 (ref 5–15)
BUN: 20 mg/dL (ref 8–23)
CO2: 26 mmol/L (ref 22–32)
Calcium: 9.5 mg/dL (ref 8.9–10.3)
Chloride: 104 mmol/L (ref 98–111)
Creatinine, Ser: 1 mg/dL (ref 0.44–1.00)
GFR, Estimated: 54 mL/min — ABNORMAL LOW (ref >60)
Glucose, Bld: 111 mg/dL — ABNORMAL HIGH (ref 70–99)
Potassium: 4.8 mmol/L (ref 3.5–5.1)
Sodium: 139 mmol/L (ref 135–145)

## 2023-12-09 LAB — URINALYSIS, ROUTINE W REFLEX MICROSCOPIC
Bilirubin Urine: NEGATIVE
Glucose, UA: NEGATIVE mg/dL
Hgb urine dipstick: NEGATIVE
Ketones, ur: 5 mg/dL — AB
Leukocytes,Ua: NEGATIVE
Nitrite: NEGATIVE
Protein, ur: NEGATIVE mg/dL
Specific Gravity, Urine: 1.016 (ref 1.005–1.030)
pH: 5 (ref 5.0–8.0)

## 2023-12-09 LAB — TROPONIN I (HIGH SENSITIVITY): Troponin I (High Sensitivity): 8 ng/L (ref <18)

## 2023-12-09 LAB — CBC
HCT: 44.2 % (ref 36.0–46.0)
Hemoglobin: 14.6 g/dL (ref 12.0–15.0)
MCH: 30.6 pg (ref 26.0–34.0)
MCHC: 33 g/dL (ref 30.0–36.0)
MCV: 92.7 fL (ref 80.0–100.0)
Platelets: 263 10*3/uL (ref 150–400)
RBC: 4.77 MIL/uL (ref 3.87–5.11)
RDW: 12.8 % (ref 11.5–15.5)
WBC: 8.9 10*3/uL (ref 4.0–10.5)
nRBC: 0 % (ref 0.0–0.2)

## 2023-12-09 MED ORDER — ACETAMINOPHEN 325 MG PO TABS
650.0000 mg | ORAL_TABLET | Freq: Once | ORAL | Status: AC
  Administered 2023-12-09: 650 mg via ORAL
  Filled 2023-12-09: qty 2
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
## 2023-12-09 NOTE — Discharge Instructions (Signed)
 Take 650 mg of Tylenol every 6 hours as needed for your knee pain.
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
## 2023-12-14 ENCOUNTER — Ambulatory Visit (INDEPENDENT_AMBULATORY_CARE_PROVIDER_SITE_OTHER): Admitting: Family Medicine

## 2023-12-14 ENCOUNTER — Encounter: Payer: Self-pay | Admitting: Family Medicine

## 2023-12-14 VITALS — BP 142/74 | HR 62 | Temp 97.9°F | Ht 66.0 in | Wt 155.8 lb

## 2023-12-14 DIAGNOSIS — S065XAA Traumatic subdural hemorrhage with loss of consciousness status unknown, initial encounter: Secondary | ICD-10-CM

## 2023-12-14 NOTE — Patient Instructions (Addendum)
 Please update your address on the way out.  Take care.  Glad to see you. Keep using your walker and I'll check on getting PT set up at home.   Try to get a protein/meat with each meal.
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
## 2023-12-16 NOTE — Assessment & Plan Note (Signed)
 Fortunately repeat CT is not worse.  She does not have any cranial nerve abnormalities.  Still using her walker.  Discussed getting home health PT set up.  Order placed.  Fall cautions discussed with patient.  I asked her to keep using her walker.

## 2023-12-23 ENCOUNTER — Telehealth: Payer: Self-pay

## 2023-12-23 NOTE — Telephone Encounter (Signed)
 Copied from CRM [id number]. Topic: General - Other >> Dec 23, 2023  9:17 AM Fredrich Romans wrote: Reason for CRM: Patient would like to know the name of the organization where Dr Para March sent out referral  for PT to so that she can look out for their phone call.She said that she do not answer numbers that she doesn't know.

## 2023-12-28 NOTE — Telephone Encounter (Signed)
 [facility] Referral sent to [facility].   Waiting to see if able to accept this patient.

## 2023-12-29 ENCOUNTER — Ambulatory Visit
Admission: RE | Admit: 2023-12-29 | Discharge: 2023-12-29 | Disposition: A | Discharge status: Home / Self Care | Source: Ambulatory Visit | Attending: Neurosurgery | Admitting: Neurosurgery

## 2023-12-29 DIAGNOSIS — S065XAA Traumatic subdural hemorrhage with loss of consciousness status unknown, initial encounter: Secondary | ICD-10-CM | POA: Diagnosis present

## 2023-12-31 NOTE — Telephone Encounter (Signed)
 Follow up message sent to [facility]

## 2024-01-01 NOTE — Telephone Encounter (Signed)
 Patient has been accepted by [facility]. Start of care soon,within next few days.  See referral for updates.

## 2024-01-04 NOTE — Progress Notes (Unsigned)
 Referring Physician:  No referring provider defined for this encounter.  Primary Physician:  Joaquim Nam, MD  History of Present Illness: 01/05/2024 [name] is an 89 y.o here today for follow up of SDH. Patient had a fall and went to th ER on 12/09/23. She reports some generalized fatigue since her ER visit but denies any headaches, dizziness, changes in vision or focal neurologic deficits   Review of Systems:  A 10 point review of systems is negative, except for the pertinent positives and negatives detailed in the HPI.  Past Medical History: Past Medical History:  Diagnosis Date   Acquired clavicle deformity    medial portion of R clavicle with enlargement- prev neg w/u per patient report   Arthritis    Osteoarthritis   Diverticulitis    Diverticulosis of colon without hemorrhage    GERD (gastroesophageal reflux disease)    HOH (hard of hearing)    Hx of dysplastic nevus 09/30/2015   R med. dorsal foot   Hyperlipidemia     Past Surgical History: Past Surgical History:  Procedure Laterality Date   ABDOMINAL HYSTERECTOMY  1980's   CATARACT EXTRACTION Left 2015   CATARACT EXTRACTION W/PHACO Right 06/04/2015   Procedure: CATARACT EXTRACTION PHACO AND INTRAOCULAR LENS PLACEMENT (IOC);  Surgeon: Sallee Lange, MD;  Location: [facility] ORS;  Service: Ophthalmology;  Laterality: Right;  LOT# [id number] H [place]: 01:39.5 AP: 25.9% CDE: 42.63   CATARACT EXTRACTION, BILATERAL     CHOLECYSTECTOMY  2006   COLONOSCOPY WITH PROPOFOL N/A 03/06/2020   Procedure: COLONOSCOPY WITH PROPOFOL;  Surgeon: Pasty Spillers, MD;  Location: [facility] ENDOSCOPY;  Service: Endoscopy;  Laterality: N/A;   JOINT REPLACEMENT Bilateral    SKIN SURGERY  08/06/2017   Dr. Gwen Pounds   Stress Cardiolite  04/24/11   Normal perfusion study with no stress induced ischemia   TONSILLECTOMY AND ADENOIDECTOMY     TOTAL HIP ARTHROPLASTY Left 12/16/2016   Procedure: TOTAL HIP ARTHROPLASTY ANTERIOR APPROACH;   Surgeon: Kennedy Bucker, MD;  Location: [facility] ORS;  Service: Orthopedics;  Laterality: Left;   TOTAL HIP ARTHROPLASTY Right 11/24/2017   Procedure: TOTAL HIP ARTHROPLASTY ANTERIOR APPROACH;  Surgeon: Kennedy Bucker, MD;  Location: [facility] ORS;  Service: Orthopedics;  Laterality: Right;    Allergies: Allergies as of 01/05/2024 - Review Complete 01/05/2024  Allergen Reaction Noted   Lactose intolerance (gi)  10/30/2020    Medications: Outpatient Encounter Medications as of 01/05/2024  Medication Sig   Multiple Vitamin (MULTIVITAMIN WITH MINERALS) TABS tablet Take 1 tablet by mouth daily. Centrum Silver   Probiotic Product (PROBIOTIC DAILY PO) Take 1 capsule by mouth daily with supper.   sodium chloride (OCEAN) 0.65 % SOLN nasal spray Place 1 spray into both nostrils 4 (four) times daily as needed for congestion.   No facility-administered encounter medications on file as of 01/05/2024.    Social History: Social History   Tobacco Use   Smoking status: Former    Current packs/day: 0.00    Types: Cigarettes    Quit date: 09/22/1986    Years since quitting: 37.3   Smokeless tobacco: Never  Vaping Use   Vaping status: Never Used  Substance Use Topics   Alcohol use: Yes    Comment: Occassionally-rare   Drug use: No    Family Medical History: Family History  Problem Relation Age of Onset   Stroke Mother    COPD Sister    Thyroid disease Sister    Heart disease Brother    Thyroid  disease Other    Colon cancer Neg Hx    Colon polyps Neg Hx    Diabetes Neg Hx    Kidney disease Neg Hx    Esophageal cancer Neg Hx    Breast cancer Neg Hx     Physical Examination: Today's Vitals   01/05/24 1353  BP: 126/74  Weight: 69.9 kg  Height: 5\' 6"  (1.676 m)  PainSc: 0-No pain   Body mass index is 24.86 kg/m.   General: Patient is well developed, well nourished, calm, collected, and in no apparent distress. Attention to examination is appropriate.  Psychiatric: Patient is  non-anxious.  Head:  Pupils equal, round, and reactive to light.  ENT:  Oral mucosa appears well hydrated.  Neck:   Supple.    Respiratory: Patient is breathing without any difficulty.  Extremities: No edema.  Vascular: Palpable dorsal pedal pulses.  Skin:   On exposed skin, there are no abnormal skin lesions.  NEUROLOGICAL:     Awake, alert, oriented to person, place, and time.  Speech is clear and fluent. Fund of knowledge is appropriate. Pt is mildly hard of hearing  Cranial Nerves: Pupils equal round and reactive to light.  Facial tone is symmetric.  Facial sensation is symmetric.   Strength: Side Biceps Triceps Deltoid Interossei Grip Wrist Ext. Wrist Flex.  R 5 5 5 5 5 5 5   L 5 5 5 5 5 5 5    Side Iliopsoas Quads Hamstring PF DF EHL  R 5 5 5 5 5 5   L 5 5 5 5 5 5    Pt ambulates with the assistance of a cane No evidence of dysmetria noted.  Medical Decision Making  Imaging: CT head 12/29/23 Radiology read pending. Appears to be nearly resolved.   CT head 12/09/23 IMPRESSION: Subdural hematoma along the falx is becoming less dense and slightly smaller. Maximal thickness is 7.7 mm today compared with 9.9 mm previously. No mass effect. No evidence of any new intracranial hemorrhage.     Electronically Signed   By: Bettylou Brunner M.D.   On: 12/09/2023 15:36  I have personally reviewed the images and agree with the above interpretation.  Assessment and Plan: [name] is a pleasant 89 y.o. female presenting today for hospital follow up of SDH. CT head from 12/29/23 is reassuring as is her clinical presentation and exam.  I will contact her should the radiologist find anything concerning otherwise we will see her going forward on an as-needed basis.  Thank you for involving me in the care of this patient.   I spent a total of 18 minutes in both face-to-face and non-face-to-face activities for this visit on the date of this encounter including review of records,  review of imaging symptoms, physical exam, and documentation.Anastacio Karvonen Dept. of Neurosurgery

## 2024-01-05 ENCOUNTER — Ambulatory Visit: Admitting: Neurosurgery

## 2024-01-05 ENCOUNTER — Encounter: Payer: Self-pay | Admitting: Neurosurgery

## 2024-01-05 VITALS — BP 126/74 | Ht 66.0 in | Wt 154.0 lb

## 2024-01-05 DIAGNOSIS — W19XXXA Unspecified fall, initial encounter: Secondary | ICD-10-CM

## 2024-01-05 DIAGNOSIS — S065XAA Traumatic subdural hemorrhage with loss of consciousness status unknown, initial encounter: Secondary | ICD-10-CM

## 2024-01-24 DIAGNOSIS — S065X0D Traumatic subdural hemorrhage without loss of consciousness, subsequent encounter: Secondary | ICD-10-CM | POA: Diagnosis not present

## 2024-01-24 DIAGNOSIS — H9193 Unspecified hearing loss, bilateral: Secondary | ICD-10-CM

## 2024-01-24 DIAGNOSIS — I129 Hypertensive chronic kidney disease with stage 1 through stage 4 chronic kidney disease, or unspecified chronic kidney disease: Secondary | ICD-10-CM | POA: Diagnosis not present

## 2024-01-24 DIAGNOSIS — G629 Polyneuropathy, unspecified: Secondary | ICD-10-CM

## 2024-01-24 DIAGNOSIS — I714 Abdominal aortic aneurysm, without rupture, unspecified: Secondary | ICD-10-CM | POA: Diagnosis not present

## 2024-01-24 DIAGNOSIS — Z974 Presence of external hearing-aid: Secondary | ICD-10-CM

## 2024-01-24 DIAGNOSIS — Z556 Problems related to health literacy: Secondary | ICD-10-CM

## 2024-01-24 DIAGNOSIS — K5730 Diverticulosis of large intestine without perforation or abscess without bleeding: Secondary | ICD-10-CM

## 2024-01-24 DIAGNOSIS — K219 Gastro-esophageal reflux disease without esophagitis: Secondary | ICD-10-CM

## 2024-01-24 DIAGNOSIS — N1831 Chronic kidney disease, stage 3a: Secondary | ICD-10-CM | POA: Diagnosis not present

## 2024-01-24 DIAGNOSIS — E785 Hyperlipidemia, unspecified: Secondary | ICD-10-CM

## 2024-01-24 DIAGNOSIS — Z5982 Transportation insecurity: Secondary | ICD-10-CM

## 2024-02-01 ENCOUNTER — Ambulatory Visit (INDEPENDENT_AMBULATORY_CARE_PROVIDER_SITE_OTHER): Admitting: Family Medicine

## 2024-02-01 ENCOUNTER — Encounter: Payer: Self-pay | Admitting: Family Medicine

## 2024-02-01 VITALS — BP 128/72 | HR 66 | Temp 97.6°F | Ht 66.0 in | Wt 150.4 lb

## 2024-02-01 DIAGNOSIS — I739 Peripheral vascular disease, unspecified: Secondary | ICD-10-CM | POA: Diagnosis not present

## 2024-02-01 MED ORDER — DOCUSATE SODIUM 100 MG PO CAPS: 100.0000 mg | ORAL_CAPSULE | Freq: Two times a day (BID) | ORAL | Status: AC | PRN

## 2024-02-01 NOTE — Patient Instructions (Addendum)
 If you have cramping with walking or if you have skin changes on your legs then let me know.   We can repeat the ABI in about 1 year.  Take care.  Glad to see you.

## 2024-02-01 NOTE — Progress Notes (Unsigned)
 Had PAD screening done out of clinic with mild ABI changes.  No leg cramping with or without walking.    She had a nose bleed last night, then first time in a long while.  Not having bleeding o/w.   No CP.  Not SOB.   Mildly dec B DP pulses.     We can repeat the ABI in about 1 year.

## 2024-02-03 DIAGNOSIS — I739 Peripheral vascular disease, unspecified: Secondary | ICD-10-CM | POA: Insufficient documentation

## 2024-02-03 NOTE — Assessment & Plan Note (Signed)
 Mild PAD based on ABI.  0.75 on the left and 0.72 on the right.  This was done in April 2025.  She has mild decrease in pulses but normal perfusion otherwise on exam.  She does not have claudication.  Okay for outpatient follow-up.  We can repeat in about 1 year.  Routine cautions given the patient.  She agrees with plan.

## 2024-02-05 ENCOUNTER — Telehealth: Payer: Self-pay

## 2024-02-05 NOTE — Telephone Encounter (Signed)
 Patient notified

## 2024-02-05 NOTE — Telephone Encounter (Signed)
 I just want to be sure that this patient can take this. They do have Ensure that is lactose free as well as Lactaid Fast Act. Not sure what you prefer the patient to take or not to take.

## 2024-02-05 NOTE — Telephone Encounter (Signed)
 If she is going to try it, I would try the lactose free version.  Thanks.

## 2024-02-05 NOTE — Telephone Encounter (Signed)
 Copied from CRM [id number]. Topic: Clinical - Medical Advice >> Feb 05, 2024  7:36 AM [place] A wrote: Reason for CRM: Patient called for medical advice and would like to know if she can drink Ensure? Because she is Lactose Intolerant. Please call

## 2024-03-10 ENCOUNTER — Ambulatory Visit

## 2024-03-10 VITALS — Ht 66.0 in | Wt 150.0 lb

## 2024-03-10 DIAGNOSIS — Z0000 Encounter for general adult medical examination without abnormal findings: Secondary | ICD-10-CM | POA: Diagnosis not present

## 2024-03-10 NOTE — Patient Instructions (Signed)
 Ms. Chretien , Thank you for taking time out of your busy schedule to complete your Annual Wellness Visit with me. I enjoyed our conversation and look forward to speaking with you again next year. I, as well as your care team,  appreciate your ongoing commitment to your health goals. Please review the following plan we discussed and let me know if I can assist you in the future. Your Game plan/ To Do List     Follow up Visits: Next Medicare AWV with our clinical staff: 03/13/25 @ 8:50am televisit   Have you seen your provider in the last 6 months (3 months if uncontrolled diabetes)? Yes Next Office Visit with your provider: 04/25/24 Physical  Clinician Recommendations:  Aim for 30 minutes of exercise or brisk walking, 6-8 glasses of water, and 5 servings of fruits and vegetables each day.       This is a list of the screening recommended for you and due dates:  Health Maintenance  Topic Date Due   Zoster (Shingles) Vaccine (1 of 2) 03/08/1984   COVID-19 Vaccine (9 - 2024-25 season) 05/24/2023   DTaP/Tdap/Td vaccine (3 - Td or Tdap) 12/02/2024*   Flu Shot  04/22/2024   Medicare Annual Wellness Visit  03/10/2025   Pneumococcal Vaccine for age over 59  Completed   DEXA scan (bone density measurement)  Completed   HPV Vaccine  Aged Out   Meningitis B Vaccine  Aged Out  *Topic was postponed. The date shown is not the original due date.    Advanced directives: (Copy Requested) Please bring a copy of your health care power of attorney and living will to the office to be added to your chart at your convenience. You can mail to [facility] [address] [place], [place] [address] or email to [email or web address] Advance Care Planning is important because it:  [x]  Makes sure you receive the medical care that is consistent with your values, goals, and preferences  [x]  It provides guidance to your family and loved ones and reduces their decisional burden about whether or not  they are making the right decisions based on your wishes.  Follow the link provided in your after visit summary or read over the paperwork we have mailed to you to help you started getting your Advance Directives in place. If you need assistance in completing these, please reach out to us  so that we can help you!

## 2024-03-10 NOTE — Progress Notes (Signed)
 Please attest and cosign this visit due to patients primary care provider not being in the office at the time the visit was completed.   Subjective:   Linda Juarez is a 89 y.o. who presents for a Medicare Wellness preventive visit.  As a reminder, Annual Wellness Visits don't include a physical exam, and some assessments may be limited, especially if this visit is performed virtually. We may recommend an in-person follow-up visit with your provider if needed.  Visit Complete: Virtual I connected with  Hubbard Mad on 03/10/24 by a audio enabled telemedicine application and verified that I am speaking with the correct person using two identifiers.  Patient Location: Home  Provider Location: Office/Clinic  I discussed the limitations of evaluation and management by telemedicine. The patient expressed understanding and agreed to proceed.  Vital Signs: Because this visit was a virtual/telehealth visit, some criteria may be missing or patient reported. Any vitals not documented were not able to be obtained and vitals that have been documented are patient reported.  VideoDeclined- This patient declined Librarian, academic. Therefore the visit was completed with audio only.  Persons Participating in Visit: Patient.  AWV Questionnaire: No: Patient Medicare AWV questionnaire was not completed prior to this visit.  Cardiac Risk Factors include: advanced age (>70men, >75 women);hypertension     Objective:    Today's Vitals   03/10/24 0853  Weight: 150 lb (68 kg)  Height: 5' 6 (1.676 m)   Body mass index is 24.21 kg/m.     03/10/2024    9:07 AM 12/09/2023    1:46 PM 11/29/2023    8:22 PM 11/29/2023    8:14 PM 11/29/2023   11:25 AM 11/29/2023    9:42 AM 02/12/2022    3:05 PM  Advanced Directives  Does Patient Have a Medical Advance Directive? Yes Yes Yes Yes No No Yes  Type of Estate agent of [place];Living will  Healthcare Power of  [employer] Power of [employer] Power of [place];Living will  Does patient want to make changes to medical advance directive?  No - Patient declined No - Patient declined      Copy of Healthcare Power of Attorney in Chart? No - copy requested No - copy requested  No - copy requested   Yes - validated most recent copy scanned in chart (See row information)  Would patient like information on creating a medical advance directive?  No - Patient declined  No - Patient declined No - Patient declined  No - Patient declined    Current Medications (verified) Outpatient Encounter Medications as of 03/10/2024  Medication Sig   docusate sodium  (COLACE) 100 MG capsule Take 1 capsule (100 mg total) by mouth 2 (two) times daily as needed for mild constipation.   Multiple Vitamin (MULTIVITAMIN WITH MINERALS) TABS tablet Take 1 tablet by mouth daily. Centrum Silver   Probiotic Product (PROBIOTIC DAILY PO) Take 1 capsule by mouth daily with supper.   sodium chloride  (OCEAN) 0.65 % SOLN nasal spray Place 1 spray into both nostrils 4 (four) times daily as needed for congestion.   No facility-administered encounter medications on file as of 03/10/2024.    Allergies (verified) Lactose intolerance (gi)   History: Past Medical History:  Diagnosis Date   Acquired clavicle deformity    medial portion of R clavicle with enlargement- prev neg w/u per patient report   Arthritis    Osteoarthritis   Diverticulitis    Diverticulosis  of colon without hemorrhage    GERD (gastroesophageal reflux disease)    HOH (hard of hearing)    Hx of dysplastic nevus 09/30/2015   R med. dorsal foot   Hyperlipidemia    Past Surgical History:  Procedure Laterality Date   ABDOMINAL HYSTERECTOMY  1980's   CATARACT EXTRACTION Left 2015   CATARACT EXTRACTION W/PHACO Right 06/04/2015   Procedure: CATARACT EXTRACTION PHACO AND INTRAOCULAR LENS PLACEMENT (IOC);  Surgeon: Steven Dingeldein, MD;  Location: [facility];   Service: Ophthalmology;  Laterality: Right;  LOT# [id number] H US : 01:39.5 AP: 25.9% CDE: 42.63   CATARACT EXTRACTION, BILATERAL     CHOLECYSTECTOMY  2006   COLONOSCOPY WITH PROPOFOL  N/A 03/06/2020   Procedure: COLONOSCOPY WITH PROPOFOL ;  Surgeon: Irby Mannan, MD;  Location: [facility];  Service: Endoscopy;  Laterality: N/A;   JOINT REPLACEMENT Bilateral    SKIN SURGERY  08/06/2017   Dr. Bary Likes   Stress Cardiolite  04/24/11   Normal perfusion study with no stress induced ischemia   TONSILLECTOMY AND ADENOIDECTOMY     TOTAL HIP ARTHROPLASTY Left 12/16/2016   Procedure: TOTAL HIP ARTHROPLASTY ANTERIOR APPROACH;  Surgeon: Molli Angelucci, MD;  Location: [facility];  Service: Orthopedics;  Laterality: Left;   TOTAL HIP ARTHROPLASTY Right 11/24/2017   Procedure: TOTAL HIP ARTHROPLASTY ANTERIOR APPROACH;  Surgeon: Molli Angelucci, MD;  Location: [facility];  Service: Orthopedics;  Laterality: Right;   Family History  Problem Relation Age of Onset   Stroke Mother    COPD Sister    Thyroid  disease Sister    Heart disease Brother    Thyroid  disease Other    Colon cancer Neg Hx    Colon polyps Neg Hx    Diabetes Neg Hx    Kidney disease Neg Hx    Esophageal cancer Neg Hx    Breast cancer Neg Hx    Social History   Socioeconomic History   Marital status: Widowed    Spouse name: Not on file   Number of children: 0   Years of education: Not on file   Highest education level: Not on file  Occupational History   Occupation: Retired    Associate Professor: UNEMPLOYED  Tobacco Use   Smoking status: Former    Current packs/day: 0.00    Types: Cigarettes    Quit date: 09/22/1986    Years since quitting: 37.4   Smokeless tobacco: Never  Vaping Use   Vaping status: Never Used  Substance and Sexual Activity   Alcohol use: Yes    Comment: Occassionally-rare   Drug use: No   Sexual activity: Not on file  Other Topics Concern   Not on file  Social History Narrative   Married 1977, widowed 2015-  husband had cancer   2 step children, 4 step grand kids   Retired   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   Social Drivers of Health   Financial Resource Strain: Low Risk  (03/10/2024)   Overall Financial Resource Strain (CARDIA)    Difficulty of Paying Living Expenses: Not hard at all  Food Insecurity: No Food Insecurity (03/10/2024)   Hunger Vital Sign    Worried About Running Out of Food in the Last Year: Never true    Ran Out of Food in the Last Year: Never true  Transportation Needs: No Transportation Needs (03/10/2024)   PRAPARE - Administrator, Civil Service (Medical): No    Lack of Transportation (Non-Medical): No  Physical Activity: Insufficiently Active (03/10/2024)  Exercise Vital Sign    Days of Exercise per Week: 4 days    Minutes of Exercise per Session: 30 min  Stress: No Stress Concern Present (03/10/2024)   [employer] of Occupational Health - Occupational Stress Questionnaire    Feeling of Stress: Not at all  Social Connections: Moderately Integrated (03/10/2024)   Social Connection and Isolation Panel    Frequency of Communication with Friends and Family: More than three times a week    Frequency of Social Gatherings with Friends and Family: More than three times a week    Attends Religious Services: More than 4 times per year    Active Member of [employer] or Organizations: Yes    Attends Banker Meetings: More than 4 times per year    Marital Status: Widowed    Tobacco Counseling Counseling given: Not Answered  Clinical Intake:  Pre-visit preparation completed: Yes  Pain : No/denies pain    BMI - recorded: 24.21 Nutritional Status: BMI of 19-24  Normal Nutritional Risks: None Diabetes: No  No results found for: HGBA1C   How often do you need to have someone help you when you read instructions, pamphlets, or other written materials from your doctor or pharmacy?: 1 - Never  Interpreter Needed?: No  Comments: lives  alone Information entered by :: B.Frutoso Dimare,LPN   Activities of Daily Living     03/10/2024    9:07 AM 11/29/2023    8:28 PM  In your present state of health, do you have any difficulty performing the following activities:  Hearing? 1   Vision? 0   Difficulty concentrating or making decisions? 1   Walking or climbing stairs? 0   Dressing or bathing? 0   Doing errands, shopping? 0 0  Preparing Food and eating ? N   Using the Toilet? N   In the past six months, have you accidently leaked urine? N   Do you have problems with loss of bowel control? N   Managing your Medications? N   Managing your Finances? N   Housekeeping or managing your Housekeeping? N     Patient Care Team: Donnie Galea, MD as PCP - General (Family Medicine) Molli Angelucci, MD as Consulting Physician (Orthopedic Surgery) Elta Halter, MD as Referring Physician (Dermatology) Bertis Brochure, MD as Referring Physician (Ophthalmology) Lesly Raspberry, MD (Otolaryngology)  I have updated your Care Teams any recent Medical Services you may have received from other providers in the past year.     Assessment:   This is a routine wellness examination for Jamariya.  Hearing/Vision screen Hearing Screening - Comments:: Pt says her hearing is not very good:hears with hearing aids Vision Screening - Comments:: Pt says her vision is good;readers only Dr Janeice Medal   Goals Addressed             This Visit's Progress    Increase water intake   On track    03/10/24 I will attempt to drink at least 6-8 glasses of water daily.      Patient Stated   On track    03/10/24- I will try to walk more daily. (Will continue)     Patient Stated       I would like to keep doing for myself       Depression Screen     03/10/2024    9:03 AM 02/01/2024   11:49 AM 12/14/2023   12:17 PM 12/03/2023   10:02 AM 09/01/2023    2:11 PM 01/08/2023  8:56 AM 02/12/2022    2:55 PM  PHQ 2/9 Scores  PHQ - 2 Score 0 2 2 0 0  0 0  PHQ- 9 Score  4 4  0 0     Fall Risk     03/10/2024    8:56 AM 02/01/2024   11:49 AM 12/14/2023   12:17 PM 12/03/2023   10:02 AM 09/01/2023    2:11 PM  Fall Risk   Falls in the past year? 1 1 1  0 0  Number falls in past yr: 0 0 0 0 0  Injury with Fall? 0 0 1 0 0  Risk for fall due to : No Fall Risks History of fall(s) History of fall(s) No Fall Risks No Fall Risks  Follow up Education provided;Falls prevention discussed Falls evaluation completed Falls evaluation completed Falls evaluation completed Falls evaluation completed    MEDICARE RISK AT HOME:  Medicare Risk at Home Any stairs in or around the home?: No (elevator) If so, are there any without handrails?: Yes Home free of loose throw rugs in walkways, pet beds, electrical cords, etc?: Yes Adequate lighting in your home to reduce risk of falls?: Yes Life alert?: Yes (necklace) Use of a cane, walker or w/c?: No Grab bars in the bathroom?: Yes Shower chair or bench in shower?: Yes Elevated toilet seat or a handicapped toilet?: Yes  TIMED UP AND GO:  Was the test performed?  No  Cognitive Function: 6CIT completed    10/20/2019    2:59 PM 09/23/2017   11:42 AM 09/18/2016    1:32 PM  MMSE - Mini Mental State Exam  Orientation to time 5 5  5    Orientation to Place 5 5  5    Registration 3 3  3    Attention/ Calculation 5 0  0   Recall 3 3  3    Language- name 2 objects  0  0   Language- repeat 1 1 1   Language- follow 3 step command  3  3   Language- read & follow direction  0  0   Write a sentence  0  0   Copy design  0  0   Total score  20  20      Data saved with a previous flowsheet row definition        03/10/2024    9:08 AM 02/12/2022    3:13 PM  6CIT Screen  What Year? 4 points 0 points  What month? 0 points 0 points  What time? 0 points 0 points  Count back from 20 0 points 0 points  Months in reverse 0 points 2 points  Repeat phrase 0 points 0 points  Total Score 4 points 2 points     Immunizations Immunization History  Administered Date(s) Administered   Fluad Quad(high Dose 65+) 10/30/2020, 07/03/2021, 06/18/2022   Fluad Trivalent(High Dose 65+) 07/08/2023   Influenza Whole 07/28/2013   Influenza, High Dose Seasonal PF 06/30/2017, 07/03/2021   Influenza,inj,Quad PF,6+ Mos 10/09/2015, 09/18/2016, 08/16/2018, 06/23/2019   Influenza-Unspecified 12/25/2014, 06/30/2017   Moderna Covid-19 Fall Seasonal Vaccine 16yrs & older 07/07/2022   PFIZER Comirnaty(Gray Top)Covid-19 Tri-Sucrose Vaccine 02/13/2021   PFIZER(Purple Top)SARS-COV-2 Vaccination 10/07/2019, 10/28/2019, 07/11/2020, 02/13/2021, 07/03/2021   Pfizer Covid-19 Vaccine Bivalent Booster 60yrs & up 07/03/2021   Pfizer(Comirnaty)Fall Seasonal Vaccine 12 years and older 07/07/2022   Pneumococcal Conjugate-13 01/26/2015   Pneumococcal Polysaccharide-23 12/23/2012   Rsv, Bivalent, Protein Subunit Rsvpref,pf Pattricia Bores) 07/16/2022   Td 12/23/2012   Tdap 12/23/2012  Zoster, Live 03/29/2009    Screening Tests Health Maintenance  Topic Date Due   Zoster Vaccines- Shingrix (1 of 2) 03/08/1984   COVID-19 Vaccine (9 - 2024-25 season) 05/24/2023   DTaP/Tdap/Td (3 - Td or Tdap) 12/02/2024 (Originally 12/24/2022)   INFLUENZA VACCINE  04/22/2024   Medicare Annual Wellness (AWV)  03/10/2025   Pneumococcal Vaccine: 50+ Years  Completed   DEXA SCAN  Completed   HPV VACCINES  Aged Out   Meningococcal B Vaccine  Aged Out    Health Maintenance  Health Maintenance Due  Topic Date Due   Zoster Vaccines- Shingrix (1 of 2) 03/08/1984   COVID-19 Vaccine (9 - 2024-25 season) 05/24/2023   Health Maintenance Items Addressed: None needed at this time  Additional Screening:  Vision Screening: Recommended annual ophthalmology exams for early detection of glaucoma and other disorders of the eye. Would you like a referral to an eye doctor? No    Dental Screening: Recommended annual dental exams for proper oral  hygiene  Community Resource Referral / Chronic Care Management: CRR required this visit?  No   CCM required this visit?  Appt scheduled with PCP   Plan:    I have personally reviewed and noted the following in the patient's chart:   Medical and social history Use of alcohol, tobacco or illicit drugs  Current medications and supplements including opioid prescriptions. Patient is not currently taking opioid prescriptions. Functional ability and status Nutritional status Physical activity Advanced directives List of other physicians Hospitalizations, surgeries, and ER visits in previous 12 months Vitals Screenings to include cognitive, depression, and falls Referrals and appointments  In addition, I have reviewed and discussed with patient certain preventive protocols, quality metrics, and best practice recommendations. A written personalized care plan for preventive services as well as general preventive health recommendations were provided to patient.   Nerissa Bannister, LPN   1/61/0960   After Visit Summary: (MyChart) Due to this being a telephonic visit, the after visit summary with patients personalized plan was offered to patient via MyChart   Notes: Nothing significant to report at this time.

## 2024-04-25 ENCOUNTER — Ambulatory Visit (INDEPENDENT_AMBULATORY_CARE_PROVIDER_SITE_OTHER): Admitting: Family Medicine

## 2024-04-25 ENCOUNTER — Encounter: Payer: Self-pay | Admitting: Family Medicine

## 2024-04-25 VITALS — BP 126/64 | HR 60 | Temp 97.8°F | Ht 66.73 in | Wt 158.2 lb

## 2024-04-25 DIAGNOSIS — Z0000 Encounter for general adult medical examination without abnormal findings: Secondary | ICD-10-CM

## 2024-04-25 DIAGNOSIS — G629 Polyneuropathy, unspecified: Secondary | ICD-10-CM

## 2024-04-25 DIAGNOSIS — Z7189 Other specified counseling: Secondary | ICD-10-CM | POA: Diagnosis not present

## 2024-04-25 DIAGNOSIS — S065XAA Traumatic subdural hemorrhage with loss of consciousness status unknown, initial encounter: Secondary | ICD-10-CM

## 2024-04-25 DIAGNOSIS — I739 Peripheral vascular disease, unspecified: Secondary | ICD-10-CM

## 2024-04-25 NOTE — Patient Instructions (Addendum)
Go to the lab on the way out.   If you have mychart we'll likely use that to update you.    Take care.  Glad to see you. I would get a flu shot each fall.   

## 2024-04-25 NOTE — Progress Notes (Unsigned)
 Mammogram declined 2025.  DXA declined 2025.   Colon CA screening not due.   Pap not due.  Tetanus 2014 PNA up to date.   Flu 2020.   Shingrix d/w pt.   RSV prev done.  covid vaccine d/w pt.   Flu 2024.   Advance directive d/w pt- nephew Norleen Brighter designated if patient were incapacitated.   Diet and exercise d/w pt.  History of neuropathy noted.  Recheck B12 pending.  No weakness.  H/o SDH.  No vision changes.  Hearing difficulty at baseline, has hearing aids.  Recent 6 CIT score 4.  No red flag events.  No HA.    Rare nosebleeds, once since last OV.    H/o PAD based on prev ABI.  No claudication on walking.    Meds, vitals, and allergies reviewed.   ROS: Per HPI unless specifically indicated in ROS section   GEN: nad, alert and oriented HEENT: ncat NECK: supple w/o LA CV: rrr.  PULM: ctab, no inc wob ABD: soft, +bs EXT: no edema SKIN: no acute rash Skin well-perfused.

## 2024-04-26 LAB — VITAMIN B12: Vitamin B-12: 326 pg/mL (ref 211–911)

## 2024-04-27 ENCOUNTER — Ambulatory Visit: Payer: Self-pay | Admitting: Family Medicine

## 2024-04-27 NOTE — Assessment & Plan Note (Signed)
 She is not having claudication on walking.  No change in medications at this point.  I asked her to update me as needed.

## 2024-04-27 NOTE — Assessment & Plan Note (Signed)
 See notes on labs.

## 2024-04-27 NOTE — Assessment & Plan Note (Signed)
 Mammogram declined 2025.  DXA declined 2025.   Colon CA screening not due.   Pap not due.  Tetanus 2014 PNA up to date.   Flu 2020.   Shingrix d/w pt.   RSV prev done.  covid vaccine d/w pt.   Flu 2024.   Advance directive d/w pt- nephew Norleen Brighter designated if patient were incapacitated.   Diet and exercise d/w pt.

## 2024-04-27 NOTE — Assessment & Plan Note (Signed)
 History of.  No new symptoms. Recent 6 CIT score 4.  No red flag events.  No HA.   Observe for now.  She can update me as needed.

## 2024-04-27 NOTE — Assessment & Plan Note (Signed)
Advance directive d/w pt- nephew John Faucette designated if patient were incapacitated.  

## 2025-03-13 ENCOUNTER — Ambulatory Visit
